# Patient Record
Sex: Female | Born: 1980
Health system: Southern US, Community
[De-identification: ages and names within clinical notes are randomized; demographics above are authoritative.]

## PROBLEM LIST (undated history)

## (undated) DIAGNOSIS — R87619 Unspecified abnormal cytological findings in specimens from cervix uteri: Secondary | ICD-10-CM

## (undated) DIAGNOSIS — F329 Major depressive disorder, single episode, unspecified: Secondary | ICD-10-CM

## (undated) DIAGNOSIS — IMO0002 Reserved for concepts with insufficient information to code with codable children: Secondary | ICD-10-CM

## (undated) DIAGNOSIS — J302 Other seasonal allergic rhinitis: Secondary | ICD-10-CM

## (undated) DIAGNOSIS — O3440 Maternal care for other abnormalities of cervix, unspecified trimester: Secondary | ICD-10-CM

## (undated) DIAGNOSIS — R87629 Unspecified abnormal cytological findings in specimens from vagina: Secondary | ICD-10-CM

## (undated) DIAGNOSIS — Z9889 Other specified postprocedural states: Secondary | ICD-10-CM

## (undated) HISTORY — DX: Unspecified abnormal cytological findings in specimens from vagina: R87.629

---
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
## 1999-07-22 DIAGNOSIS — F32A Depression, unspecified: Secondary | ICD-10-CM

## 1999-07-22 HISTORY — DX: Depression, unspecified: F32.A

## 2001-07-21 HISTORY — PX: EYE SURGERY: SHX253

## 2012-05-24 LAB — OB RESULTS CONSOLE RUBELLA ANTIBODY, IGM: Rubella: IMMUNE

## 2012-05-24 LAB — OB RESULTS CONSOLE HEPATITIS B SURFACE ANTIGEN: Hepatitis B Surface Ag: NEGATIVE

## 2012-05-24 LAB — OB RESULTS CONSOLE ABO/RH: RH Type: POSITIVE

## 2012-05-24 LAB — OB RESULTS CONSOLE HGB/HCT, BLOOD
HCT: 37 %
Hemoglobin: 12.9 g/dL

## 2012-05-24 LAB — OB RESULTS CONSOLE HIV ANTIBODY (ROUTINE TESTING): HIV: NONREACTIVE

## 2012-05-24 LAB — OB RESULTS CONSOLE RPR: RPR: REACTIVE

## 2012-05-24 LAB — OB RESULTS CONSOLE ANTIBODY SCREEN: Antibody Screen: NEGATIVE

## 2012-07-21 NOTE — L&D Delivery Note (Signed)
Delivery Note At 5:30 AM a viable and healthy female was delivered via Vaginal, Spontaneous Delivery (Presentation: OA;  ).  APGAR: 9, 9; weight .   Placenta status: Intact, Spontaneous.  Cord: 3 vessels with the following complications: None.    No difficulty with shoulders  Anesthesia: None  Episiotomy: None Lacerations: None Suture Repair: none Est. Blood Loss (mL): 200   Mom to postpartum.  Baby to nursery-stable.  [facility] 11/25/2012, 6:43 AM

## 2012-09-21 ENCOUNTER — Encounter: Payer: Self-pay | Admitting: Obstetrics & Gynecology

## 2012-09-28 ENCOUNTER — Ambulatory Visit (INDEPENDENT_AMBULATORY_CARE_PROVIDER_SITE_OTHER): Payer: BC Managed Care – PPO | Admitting: Obstetrics and Gynecology

## 2012-09-28 ENCOUNTER — Encounter: Payer: Self-pay | Admitting: Obstetrics and Gynecology

## 2012-09-28 VITALS — BP 102/68 | Wt 202.0 lb

## 2012-09-28 DIAGNOSIS — Z3483 Encounter for supervision of other normal pregnancy, third trimester: Secondary | ICD-10-CM

## 2012-09-28 DIAGNOSIS — Z3480 Encounter for supervision of other normal pregnancy, unspecified trimester: Secondary | ICD-10-CM

## 2012-09-28 DIAGNOSIS — R768 Other specified abnormal immunological findings in serum: Secondary | ICD-10-CM

## 2012-09-28 HISTORY — DX: Other specified abnormal immunological findings in serum: R76.8

## 2012-09-28 MED ORDER — TETANUS-DIPHTH-ACELL PERTUSSIS 5-2.5-18.5 LF-MCG/0.5 IM SUSP: 0.5000 mL | Freq: Once | INTRAMUSCULAR | Status: DC

## 2012-09-28 NOTE — Progress Notes (Signed)
Patient recently moved from [facility] to [place]. Here for first ob visit with [place]. Records reviewed and up to date. Thus far uncomplicated prenatal care with false positive RPR (RPR pos, confirmatory test- negative), Normal pap and other labs, normal anatomy. 1hr GCT, labs and TdaP today. FM/PTL precautions discussed.

## 2012-09-28 NOTE — Progress Notes (Signed)
P - 84 - new pt here as transfer from [facility] - just moved to this area less than one month ago

## 2012-09-30 LAB — CBC
HCT: 36 % (ref 36.0–46.0)
Hemoglobin: 11.2 g/dL — ABNORMAL LOW (ref 12.0–15.0)
MCH: 29.5 pg (ref 26.0–34.0)
MCHC: 31.1 g/dL (ref 30.0–36.0)
MCV: 94.7 fL (ref 78.0–100.0)
RDW: 13.7 % (ref 11.5–15.5)

## 2012-10-01 LAB — GLUCOSE TOLERANCE, 1 HOUR (50G) W/O FASTING

## 2012-10-19 ENCOUNTER — Ambulatory Visit (INDEPENDENT_AMBULATORY_CARE_PROVIDER_SITE_OTHER): Payer: BC Managed Care – PPO | Admitting: Family Medicine

## 2012-10-19 VITALS — BP 108/66 | Wt 205.0 lb

## 2012-10-19 DIAGNOSIS — Z3483 Encounter for supervision of other normal pregnancy, third trimester: Secondary | ICD-10-CM

## 2012-10-19 DIAGNOSIS — Z3480 Encounter for supervision of other normal pregnancy, unspecified trimester: Secondary | ICD-10-CM

## 2012-10-19 NOTE — Progress Notes (Signed)
Had 1 hrGTT last visit, but the lab cancelled it.  She wants to know if it is still necessary to repeat test.

## 2012-10-19 NOTE — Patient Instructions (Signed)
Pregnancy - Third Trimester The third trimester of pregnancy (the last 3 months) is a period of the most rapid growth for you and your baby. The baby approaches a length of 20 inches and a weight of 6 to 10 pounds. The baby is adding on fat and getting ready for life outside your body. While inside, babies have periods of sleeping and waking, suck their thumbs, and hiccups. You can often feel small contractions of the uterus. This is false labor. It is also called Braxton-Hicks contractions. This is like a practice for labor. The usual problems in this stage of pregnancy include more difficulty breathing, swelling of the hands and feet from water retention, and having to urinate more often because of the uterus and baby pressing on your bladder.  PRENATAL EXAMS  Blood work may continue to be done during prenatal exams. These tests are done to check on your health and the probable health of your baby. Blood work is used to follow your blood levels (hemoglobin). Anemia (low hemoglobin) is common during pregnancy. Iron and vitamins are given to help prevent this. You may also continue to be checked for diabetes. Some of the past blood tests may be done again.  The size of the uterus is measured during each visit. This makes sure your baby is growing properly according to your pregnancy dates.  Your blood pressure is checked every prenatal visit. This is to make sure you are not getting toxemia.  Your urine is checked every prenatal visit for infection, diabetes and protein.  Your weight is checked at each visit. This is done to make sure gains are happening at the suggested rate and that you and your baby are growing normally.  Sometimes, an ultrasound is performed to confirm the position and the proper growth and development of the baby. This is a test done that bounces harmless sound waves off the baby so your caregiver can more accurately determine due dates.  Discuss the type of pain medication  and anesthesia you will have during your labor and delivery.  Discuss the possibility and anesthesia if a Cesarean Section might be necessary.  Inform your caregiver if there is any mental or physical violence at home. Sometimes, a specialized non-stress test, contraction stress test and biophysical profile are done to make sure the baby is not having a problem. Checking the amniotic fluid surrounding the baby is called an amniocentesis. The amniotic fluid is removed by sticking a needle into the belly (abdomen). This is sometimes done near the end of pregnancy if an early delivery is required. In this case, it is done to help make sure the baby's lungs are mature enough for the baby to live outside of the womb. If the lungs are not mature and it is unsafe to deliver the baby, an injection of cortisone medication is given to the mother 1 to 2 days before the delivery. This helps the baby's lungs mature and makes it safer to deliver the baby. CHANGES OCCURING IN THE THIRD TRIMESTER OF PREGNANCY Your body goes through many changes during pregnancy. They vary from person to person. Talk to your caregiver about changes you notice and are concerned about.  During the last trimester, you have probably had an increase in your appetite. It is normal to have cravings for certain foods. This varies from person to person and pregnancy to pregnancy.  You may begin to get stretch marks on your hips, abdomen, and breasts. These are normal changes in the body  during pregnancy. There are no exercises or medications to take which prevent this change.  Constipation may be treated with a stool softener or adding bulk to your diet. Drinking lots of fluids, fiber in vegetables, fruits, and whole grains are helpful.  Exercising is also helpful. If you have been very active up until your pregnancy, most of these activities can be continued during your pregnancy. If you have been less active, it is helpful to start an  exercise program such as walking. Consult your caregiver before starting exercise programs.  Avoid all smoking, alcohol, un-prescribed drugs, herbs and "street drugs" during your pregnancy. These chemicals affect the formation and growth of the baby. Avoid chemicals throughout the pregnancy to ensure the delivery of a healthy infant.  Backache, varicose veins and hemorrhoids may develop or get worse.  You will tire more easily in the third trimester, which is normal.  The baby's movements may be stronger and more often.  You may become short of breath easily.  Your belly button may stick out.  A yellow discharge may leak from your breasts called colostrum.  You may have a bloody mucus discharge. This usually occurs a few days to a week before labor begins. HOME CARE INSTRUCTIONS   Keep your caregiver's appointments. Follow your caregiver's instructions regarding medication use, exercise, and diet.  During pregnancy, you are providing food for you and your baby. Continue to eat regular, well-balanced meals. Choose foods such as meat, fish, milk and other low fat dairy products, vegetables, fruits, and whole-grain breads and cereals. Your caregiver will tell you of the ideal weight gain.  A physical sexual relationship may be continued throughout pregnancy if there are no other problems such as early (premature) leaking of amniotic fluid from the membranes, vaginal bleeding, or belly (abdominal) pain.  Exercise regularly if there are no restrictions. Check with your caregiver if you are unsure of the safety of your exercises. Greater weight gain will occur in the last 2 trimesters of pregnancy. Exercising helps:  Control your weight.  Get you in shape for labor and delivery.  You lose weight after you deliver.  Rest a lot with legs elevated, or as needed for leg cramps or low back pain.  Wear a good support or jogging bra for breast tenderness during pregnancy. This may help if worn  during sleep. Pads or tissues may be used in the bra if you are leaking colostrum.  Do not use hot tubs, steam rooms, or saunas.  Wear your seat belt when driving. This protects you and your baby if you are in an accident.  Avoid raw meat, cat litter boxes and soil used by cats. These carry germs that can cause birth defects in the baby.  It is easier to loose urine during pregnancy. Tightening up and strengthening the pelvic muscles will help with this problem. You can practice stopping your urination while you are going to the bathroom. These are the same muscles you need to strengthen. It is also the muscles you would use if you were trying to stop from passing gas. You can practice tightening these muscles up 10 times a set and repeating this about 3 times per day. Once you know what muscles to tighten up, do not perform these exercises during urination. It is more likely to cause an infection by backing up the urine.  Ask for help if you have financial, counseling or nutritional needs during pregnancy. Your caregiver will be able to offer counseling for these  needs as well as refer you for other special needs.  Make a list of emergency phone numbers and have them available.  Plan on getting help from family or friends when you go home from the hospital.  Make a trial run to the hospital.  Take prenatal classes with the father to understand, practice and ask questions about the labor and delivery.  Prepare the baby's room/nursery.  Do not travel out of the city unless it is absolutely necessary and with the advice of your caregiver.  Wear only low or no heal shoes to have better balance and prevent falling. MEDICATIONS AND DRUG USE IN PREGNANCY  Take prenatal vitamins as directed. The vitamin should contain 1 milligram of folic acid. Keep all vitamins out of reach of children. Only a couple vitamins or tablets containing iron may be fatal to a baby or young child when  ingested.  Avoid use of all medications, including herbs, over-the-counter medications, not prescribed or suggested by your caregiver. Only take over-the-counter or prescription medicines for pain, discomfort, or fever as directed by your caregiver. Do not use aspirin, ibuprofen (Motrin, Advil, Nuprin) or naproxen (Aleve) unless OK'd by your caregiver.  Let your caregiver also know about herbs you may be using.  Alcohol is related to a number of birth defects. This includes fetal alcohol syndrome. All alcohol, in any form, should be avoided completely. Smoking will cause low birth rate and premature babies.  Street/illegal drugs are very harmful to the baby. They are absolutely forbidden. A baby born to an addicted mother will be addicted at birth. The baby will go through the same withdrawal an adult does. SEEK MEDICAL CARE IF: You have any concerns or worries during your pregnancy. It is better to call with your questions if you feel they cannot wait, rather than worry about them. DECISIONS ABOUT CIRCUMCISION You may or may not know the sex of your baby. If you know your baby is a boy, it may be time to think about circumcision. Circumcision is the removal of the foreskin of the penis. This is the skin that covers the sensitive end of the penis. There is no proven medical need for this. Often this decision is made on what is popular at the time or based upon religious beliefs and social issues. You can discuss these issues with your caregiver or pediatrician. SEEK IMMEDIATE MEDICAL CARE IF:   An unexplained oral temperature above 102 F (38.9 C) develops, or as your caregiver suggests.  You have leaking of fluid from the vagina (birth canal). If leaking membranes are suspected, take your temperature and tell your caregiver of this when you call.  There is vaginal spotting, bleeding or passing clots. Tell your caregiver of the amount and how many pads are used.  You develop a bad smelling  vaginal discharge with a change in the color from clear to white.  You develop vomiting that lasts more than 24 hours.  You develop chills or fever.  You develop shortness of breath.  You develop burning on urination.  You loose more than 2 pounds of weight or gain more than 2 pounds of weight or as suggested by your caregiver.  You notice sudden swelling of your face, hands, and feet or legs.  You develop belly (abdominal) pain. Round ligament discomfort is a common non-cancerous (benign) cause of abdominal pain in pregnancy. Your caregiver still must evaluate you.  You develop a severe headache that does not go away.  You develop visual  problems, blurred or double vision.  If you have not felt your baby move for more than 1 hour. If you think the baby is not moving as much as usual, eat something with sugar in it and lie down on your left side for an hour. The baby should move at least 4 to 5 times per hour. Call right away if your baby moves less than that.  You fall, are in a car accident or any kind of trauma.  There is mental or physical violence at home. Document Released: 07/01/2001 Document Revised: 09/29/2011 Document Reviewed: 01/03/2009 [facility] Patient Information 2013 [place], [place].  Contraception Choices Contraception (birth control) is the use of any methods or devices to prevent pregnancy. Below are some methods to help avoid pregnancy. HORMONAL METHODS   Contraceptive implant. This is a thin, plastic tube containing progesterone hormone. It does not contain estrogen hormone. Your caregiver inserts the tube in the inner part of the upper arm. The tube can remain in place for up to 3 years. After 3 years, the implant must be removed. The implant prevents the ovaries from releasing an egg (ovulation), thickens the cervical mucus which prevents sperm from entering the uterus, and thins the lining of the inside of the uterus.  Progesterone-only injections. These  injections are given every 3 months by your caregiver to prevent pregnancy. This synthetic progesterone hormone stops the ovaries from releasing eggs. It also thickens cervical mucus and changes the uterine lining. This makes it harder for sperm to survive in the uterus.  Birth control pills. These pills contain estrogen and progesterone hormone. They work by stopping the egg from forming in the ovary (ovulation). Birth control pills are prescribed by a caregiver.Birth control pills can also be used to treat heavy periods.  Minipill. This type of birth control pill contains only the progesterone hormone. They are taken every day of each month and must be prescribed by your caregiver.  Birth control patch. The patch contains hormones similar to those in birth control pills. It must be changed once a week and is prescribed by a caregiver.  Vaginal ring. The ring contains hormones similar to those in birth control pills. It is left in the vagina for 3 weeks, removed for 1 week, and then a new one is put back in place. The patient must be comfortable inserting and removing the ring from the vagina.A caregiver's prescription is necessary.  Emergency contraception. Emergency contraceptives prevent pregnancy after unprotected sexual intercourse. This pill can be taken right after sex or up to 5 days after unprotected sex. It is most effective the sooner you take the pills after having sexual intercourse. Emergency contraceptive pills are available without a prescription. Check with your pharmacist. Do not use emergency contraception as your only form of birth control. BARRIER METHODS   Female condom. This is a thin sheath (latex or rubber) that is worn over the penis during sexual intercourse. It can be used with spermicide to increase effectiveness.  Female condom. This is a soft, loose-fitting sheath that is put into the vagina before sexual intercourse.  Diaphragm. This is a soft, latex, dome-shaped  barrier that must be fitted by a caregiver. It is inserted into the vagina, along with a spermicidal jelly. It is inserted before intercourse. The diaphragm should be left in the vagina for 6 to 8 hours after intercourse.  Cervical cap. This is a round, soft, latex or plastic cup that fits over the cervix and must be fitted by a  caregiver. The cap can be left in place for up to 48 hours after intercourse.  Sponge. This is a soft, circular piece of polyurethane foam. The sponge has spermicide in it. It is inserted into the vagina after wetting it and before sexual intercourse.  Spermicides. These are chemicals that kill or block sperm from entering the cervix and uterus. They come in the form of creams, jellies, suppositories, foam, or tablets. They do not require a prescription. They are inserted into the vagina with an applicator before having sexual intercourse. The process must be repeated every time you have sexual intercourse. INTRAUTERINE CONTRACEPTION  Intrauterine device (IUD). This is a T-shaped device that is put in a woman's uterus during a menstrual period to prevent pregnancy. There are 2 types:  Copper IUD. This type of IUD is wrapped in copper wire and is placed inside the uterus. Copper makes the uterus and fallopian tubes produce a fluid that kills sperm. It can stay in place for 10 years.  Hormone IUD. This type of IUD contains the hormone progestin (synthetic progesterone). The hormone thickens the cervical mucus and prevents sperm from entering the uterus, and it also thins the uterine lining to prevent implantation of a fertilized egg. The hormone can weaken or kill the sperm that get into the uterus. It can stay in place for 5 years. PERMANENT METHODS OF CONTRACEPTION  Female tubal ligation. This is when the woman's fallopian tubes are surgically sealed, tied, or blocked to prevent the egg from traveling to the uterus.  Female sterilization. This is when the female has the tubes  that carry sperm tied off (vasectomy).This blocks sperm from entering the vagina during sexual intercourse. After the procedure, the man can still ejaculate fluid (semen). NATURAL PLANNING METHODS  Natural family planning. This is not having sexual intercourse or using a barrier method (condom, diaphragm, cervical cap) on days the woman could become pregnant.  Calendar method. This is keeping track of the length of each menstrual cycle and identifying when you are fertile.  Ovulation method. This is avoiding sexual intercourse during ovulation.  Symptothermal method. This is avoiding sexual intercourse during ovulation, using a thermometer and ovulation symptoms.  Post-ovulation method. This is timing sexual intercourse after you have ovulated. Regardless of which type or method of contraception you choose, it is important that you use condoms to protect against the transmission of sexually transmitted diseases (STDs). Talk with your caregiver about which form of contraception is most appropriate for you. Document Released: 07/07/2005 Document Revised: 09/29/2011 Document Reviewed: 11/13/2010 [facility] Patient Information 2013 [place], [place].  Breastfeeding Deciding to breastfeed is one of the best choices you can make for you and your baby. The information that follows gives a brief overview of the benefits of breastfeeding as well as common topics surrounding breastfeeding. BENEFITS OF BREASTFEEDING For the baby  The first milk (colostrum) helps the baby's digestive system function better.   There are antibodies in the mother's milk that help the baby fight off infections.   The baby has a lower incidence of asthma, allergies, and sudden infant death syndrome (SIDS).   The nutrients in breast milk are better for the baby than infant formulas, and breast milk helps the baby's brain grow better.   Babies who breastfeed have less gas, colic, and constipation.  For the  mother  Breastfeeding helps develop a very special bond between the mother and her baby.   Breastfeeding is convenient, always available at the correct temperature, and costs nothing.  Breastfeeding burns calories in the mother and helps her lose weight that was gained during pregnancy.   Breastfeeding makes the uterus contract back down to normal size faster and slows bleeding following delivery.   Breastfeeding mothers have a lower risk of developing breast cancer.  BREASTFEEDING FREQUENCY  A healthy, full-term baby may breastfeed as often as every hour or space his or her feedings to every 3 hours.   Watch your baby for signs of hunger. Nurse your baby if he or she shows signs of hunger. How often you nurse will vary from baby to baby.   Nurse as often as the baby requests, or when you feel the need to reduce the fullness of your breasts.   Awaken the baby if it has been 3 4 hours since the last feeding.   Frequent feeding will help the mother make more milk and will help prevent problems, such as sore nipples and engorgement of the breasts.  BABY'S POSITION AT THE BREAST  Whether lying down or sitting, be sure that the baby's tummy is facing your tummy.   Support the breast with 4 fingers underneath the breast and the thumb above. Make sure your fingers are well away from the nipple and baby's mouth.   Stroke the baby's lips gently with your finger or nipple.   When the baby's mouth is open wide enough, place all of your nipple and as much of the areola as possible into your baby's mouth.   Pull the baby in close so the tip of the nose and the baby's cheeks touch the breast during the feeding.  FEEDINGS AND SUCTION  The length of each feeding varies from baby to baby and from feeding to feeding.   The baby must suck about 2 3 minutes for your milk to get to him or her. This is called a "let down." For this reason, allow the baby to feed on each breast as long  as he or she wants. Your baby will end the feeding when he or she has received the right balance of nutrients.   To break the suction, put your finger into the corner of the baby's mouth and slide it between his or her gums before removing your breast from his or her mouth. This will help prevent sore nipples.  HOW TO TELL WHETHER YOUR BABY IS GETTING ENOUGH BREAST MILK. Wondering whether or not your baby is getting enough milk is a common concern among mothers. You can be assured that your baby is getting enough milk if:   Your baby is actively sucking and you hear swallowing.   Your baby seems relaxed and satisfied after a feeding.   Your baby nurses at least 8 12 times in a 24 hour time period. Nurse your baby until he or she unlatches or falls asleep at the first breast (at least 10 20 minutes), then offer the second side.   Your baby is wetting 5 6 disposable diapers (6 8 cloth diapers) in a 24 hour period by 57 4 days of age.   Your baby is having at least 3 4 stools every 24 hours for the first 6 weeks. The stool should be soft and yellow.   Your baby should gain 4 7 ounces per week after he or she is 76 days old.   Your breasts feel softer after nursing.  REDUCING BREAST ENGORGEMENT  In the first week after your baby is born, you may experience signs of breast engorgement. When breasts  are engorged, they feel heavy, warm, full, and may be tender to the touch. You can reduce engorgement if you:   Nurse frequently, every 2 3 hours. Mothers who breastfeed early and often have fewer problems with engorgement.   Place light ice packs on your breasts for 10 20 minutes between feedings. This reduces swelling. Wrap the ice packs in a lightweight towel to protect your skin. Bags of frozen vegetables work well for this purpose.   Take a warm shower or apply warm, moist heat to your breast for 5 10 minutes just before each feeding. This increases circulation and helps the milk flow.    Gently massage your breast before and during the feeding. Using your finger tips, massage from the chest wall towards your nipple in a circular motion.   Make sure that the baby empties at least one breast at every feeding before switching sides.   Use a breast pump to empty the breasts if your baby is sleepy or not nursing well. You may also want to pump if you are returning to work oryou feel you are getting engorged.   Avoid bottle feeds, pacifiers, or supplemental feedings of water or juice in place of breastfeeding. Breast milk is all the food your baby needs. It is not necessary for your baby to have water or formula. In fact, to help your breasts make more milk, it is best not to give your baby supplemental feedings during the early weeks.   Be sure the baby is latched on and positioned properly while breastfeeding.   Wear a supportive bra, avoiding underwire styles.   Eat a balanced diet with enough fluids.   Rest often, relax, and take your prenatal vitamins to prevent fatigue, stress, and anemia.  If you follow these suggestions, your engorgement should improve in 24 48 hours. If you are still experiencing difficulty, call your lactation consultant or caregiver.  CARING FOR YOURSELF Take care of your breasts  Bathe or shower daily.   Avoid using soap on your nipples.   Start feedings on your left breast at one feeding and on your right breast at the next feeding.   You will notice an increase in your milk supply 2 5 days after delivery. You may feel some discomfort from engorgement, which makes your breasts very firm and often tender. Engorgement "peaks" out within 24 48 hours. In the meantime, apply warm moist towels to your breasts for 5 10 minutes before feeding. Gentle massage and expression of some milk before feeding will soften your breasts, making it easier for your baby to latch on.   Wear a well-fitting nursing bra, and air dry your nipples for a 3  after each feeding.   Only use cotton bra pads.   Only use pure lanolin on your nipples after nursing. You do not need to wash it off before feeding the baby again. Another option is to express a few drops of breast milk and gently massage it into your nipples.  Take care of yourself  Eat well-balanced meals and nutritious snacks.   Drinking milk, fruit juice, and water to satisfy your thirst (about 8 glasses a day).   Get plenty of rest.  Avoid foods that you notice affect the baby in a bad way.  SEEK MEDICAL CARE IF:   You have difficulty with breastfeeding and need help.   You have a hard, red, sore area on your breast that is accompanied by a fever.   Your baby is  too sleepy to eat well or is having trouble sleeping.   Your baby is wetting less than 6 diapers a day, by 2 days of age.   Your baby's skin or white part of his or her eyes is more yellow than it was in the hospital.   You feel depressed.  Document Released: 07/07/2005 Document Revised: 01/06/2012 Document Reviewed: 10/05/2011 [facility] Patient Information 2013 [place], [place].

## 2012-10-19 NOTE — Progress Notes (Signed)
Discussed 1 hour with pt.  She will repeat 1 hour this week. Otherwise doing well.  No concerns.

## 2012-10-21 ENCOUNTER — Other Ambulatory Visit: Payer: BC Managed Care – PPO | Admitting: *Deleted

## 2012-10-21 DIAGNOSIS — Z3482 Encounter for supervision of other normal pregnancy, second trimester: Secondary | ICD-10-CM

## 2012-10-22 ENCOUNTER — Encounter: Payer: Self-pay | Admitting: Family Medicine

## 2012-10-27 ENCOUNTER — Other Ambulatory Visit (INDEPENDENT_AMBULATORY_CARE_PROVIDER_SITE_OTHER): Payer: BC Managed Care – PPO | Admitting: *Deleted

## 2012-10-27 DIAGNOSIS — R7309 Other abnormal glucose: Secondary | ICD-10-CM

## 2012-10-27 NOTE — Progress Notes (Signed)
Patient is here for 3 hour glucose tolerance testing.

## 2012-10-28 ENCOUNTER — Encounter: Payer: Self-pay | Admitting: Family Medicine

## 2012-10-28 LAB — GLUCOSE TOLERANCE, 3 HOURS: Glucose, GTT - 3 Hour: 67 mg/dL — ABNORMAL LOW (ref 70–144)

## 2012-11-02 ENCOUNTER — Encounter: Payer: Self-pay | Admitting: Obstetrics and Gynecology

## 2012-11-02 ENCOUNTER — Ambulatory Visit (INDEPENDENT_AMBULATORY_CARE_PROVIDER_SITE_OTHER): Payer: BC Managed Care – PPO | Admitting: Obstetrics and Gynecology

## 2012-11-02 VITALS — BP 110/70 | Wt 207.0 lb

## 2012-11-02 DIAGNOSIS — R768 Other specified abnormal immunological findings in serum: Secondary | ICD-10-CM

## 2012-11-02 DIAGNOSIS — Z3483 Encounter for supervision of other normal pregnancy, third trimester: Secondary | ICD-10-CM

## 2012-11-02 DIAGNOSIS — Z3480 Encounter for supervision of other normal pregnancy, unspecified trimester: Secondary | ICD-10-CM

## 2012-11-02 NOTE — Progress Notes (Signed)
Patient doing well without complaints. FM/PTL precautions reviewed. Patient remains remains undecided on birth control. Husband was suppose to have vasectomy but desires to try again for a boy. She is considering BTL vs LARC. Cultures next visit.

## 2012-11-02 NOTE — Patient Instructions (Signed)
Etonogestrel implant What is this medicine? ETONOGESTREL is a contraceptive (birth control) device. It is used to prevent pregnancy. It can be used for up to 3 years. This medicine may be used for other purposes; ask your health care provider or pharmacist if you have questions. What should I tell my health care provider before I take this medicine? They need to know if you have any of these conditions: -abnormal vaginal bleeding -blood vessel disease or blood clots -cancer of the breast, cervix, or liver -depression -diabetes -gallbladder disease -headaches -heart disease or recent heart attack -high blood pressure -high cholesterol -kidney disease -liver disease -renal disease -seizures -tobacco smoker -an unusual or allergic reaction to etonogestrel, other hormones, anesthetics or antiseptics, medicines, foods, dyes, or preservatives -pregnant or trying to get pregnant -breast-feeding How should I use this medicine? This device is inserted just under the skin on the inner side of your upper arm by a health care professional. Talk to your pediatrician regarding the use of this medicine in children. Special care may be needed. Overdosage: If you think you've taken too much of this medicine contact a poison control center or emergency room at once. Overdosage: If you think you have taken too much of this medicine contact a poison control center or emergency room at once. NOTE: This medicine is only for you. Do not share this medicine with others. What if I miss a dose? This does not apply. What may interact with this medicine? Do not take this medicine with any of the following medications: -amprenavir -bosentan -fosamprenavir This medicine may also interact with the following medications: -barbiturate medicines for inducing sleep or treating seizures -certain medicines for fungal infections like ketoconazole and itraconazole -griseofulvin -medicines to treat seizures like  carbamazepine, felbamate, oxcarbazepine, phenytoin, topiramate -modafinil -phenylbutazone -rifampin -some medicines to treat HIV infection like atazanavir, indinavir, lopinavir, nelfinavir, tipranavir, ritonavir -St. John's wort This list may not describe all possible interactions. Give your health care provider a list of all the medicines, herbs, non-prescription drugs, or dietary supplements you use. Also tell them if you smoke, drink alcohol, or use illegal drugs. Some items may interact with your medicine. What should I watch for while using this medicine? This product does not protect you against HIV infection (AIDS) or other sexually transmitted diseases. You should be able to feel the implant by pressing your fingertips over the skin where it was inserted. Tell your doctor if you cannot feel the implant. What side effects may I notice from receiving this medicine? Side effects that you should report to your doctor or health care professional as soon as possible: -allergic reactions like skin rash, itching or hives, swelling of the face, lips, or tongue -breast lumps -changes in vision -confusion, trouble speaking or understanding -dark urine -depressed mood -general ill feeling or flu-like symptoms -light-colored stools -loss of appetite, nausea -right upper belly pain -severe headaches -severe pain, swelling, or tenderness in the abdomen -shortness of breath, chest pain, swelling in a leg -signs of pregnancy -sudden numbness or weakness of the face, arm or leg -trouble walking, dizziness, loss of balance or coordination -unusual vaginal bleeding, discharge -unusually weak or tired -yellowing of the eyes or skin Side effects that usually do not require medical attention (Report these to your doctor or health care professional if they continue or are bothersome.): -acne -breast pain -changes in weight -cough -fever or chills -headache -irregular menstrual  bleeding -itching, burning, and vaginal discharge -pain or difficulty passing urine -sore throat This   list may not describe all possible side effects. Call your doctor for medical advice about side effects. You may report side effects to FDA at 1-800-FDA-1088. Where should I keep my medicine? This drug is given in a hospital or clinic and will not be stored at home. NOTE: This sheet is a summary. It may not cover all possible information. If you have questions about this medicine, talk to your doctor, pharmacist, or health care provider.  2013, [employer]. (03/30/2009 3:54:17 PM)   Contraception Choices Birth control (contraception) can stop pregnancy from happening. Different types of birth control work in different ways. Some can:  Make the mucus in the cervix thick. This makes it hard for sperm to get into the uterus.  Thin the lining of the uterus. This makes it hard for an egg to attach to the wall of the uterus.  Stop the ovaries from releasing an egg.  Block the sperm from reaching the egg. Certain types of surgery can stop pregnancy from happening. For women, the sugery closes the fallopian tubes (tubal ligation). For men, the surgery stops sperm from releasing during sex (vasectomy). HORMONAL BIRTH CONTROL Hormonal birth control stops pregnancy by putting hormones into your body. Types of birth control include:  A small tube put under the skin of the upper arm (implant). The tube can stay in place for 3 years.  Shots given every 3 months.  Pills taken every day or once after sex (intercourse).  Patches that are changed once a week.  A ring put into the vagina (vaginal ring). The ring is left in place for 3 weeks and removed for 1 week. Then, a new ring is put in the vagina. BARRIER BIRTH CONTROL  Barrier birth control blocks sperm from reaching the egg. Types of birth control include:   A thin covering worn on the penis (female condom) during sex.  A soft,  loose covering put into the vagina (female condom) before sex.  A rubber bowl that sits over the cervix (diaphragm). The bowl must be made for you. The bowl is put into the vagina before sex. The bowl is left in place for 6 to 8 hours after sex.  A small, soft cup that fits over the cervix (cervical cap). The cup must be made for you. The cup can be left in place for 48 hours after sex.  A sponge that is put into the vagina before sex.  A chemical that kills or blocks sperm from getting into the cervix and uterus (spermicide). The chemical may be a cream, jelly, foam, or pill. INTRAUTERINE (IUD) BIRTH CONTROL  IUD birth control is a small, T-shaped piece of plastic. The plastic is put inside the uterus. There are 2 types of IUD:  Copper IUD. The IUD is covered in copper wire. The copper makes a fluid that kills sperm. It can stay in place for 10 years.  Hormone IUD. The hormone stops pregnancy from happening. It can stay in place for 5 years. NATURAL FAMILY PLANNING BIRTH CONTROL  Natural family planning means not having sex or using barrier birth control when the woman is fertile. A woman can:  Use a calendar to keep track of when she is fertile.  Use a thermometer to measure her body temperature. Protect yourself against sexual diseases no matter what type of birth control you use. Talk to your doctor about which type of birth control is best for you. Document Released: 05/04/2009 Document Revised: 09/29/2011 Document Reviewed: 11/13/2010 [employer] Patient  Information 2013 [place], [place].

## 2012-11-09 ENCOUNTER — Encounter: Payer: Self-pay | Admitting: Family Medicine

## 2012-11-09 ENCOUNTER — Ambulatory Visit (INDEPENDENT_AMBULATORY_CARE_PROVIDER_SITE_OTHER): Payer: BC Managed Care – PPO | Admitting: Family Medicine

## 2012-11-09 VITALS — BP 107/73 | Wt 208.0 lb

## 2012-11-09 DIAGNOSIS — R768 Other specified abnormal immunological findings in serum: Secondary | ICD-10-CM

## 2012-11-09 DIAGNOSIS — Z3483 Encounter for supervision of other normal pregnancy, third trimester: Secondary | ICD-10-CM

## 2012-11-09 DIAGNOSIS — Z3480 Encounter for supervision of other normal pregnancy, unspecified trimester: Secondary | ICD-10-CM

## 2012-11-09 LAB — OB RESULTS CONSOLE GBS: GBS: NEGATIVE

## 2012-11-09 LAB — OB RESULTS CONSOLE GC/CHLAMYDIA: Chlamydia: NEGATIVE

## 2012-11-09 NOTE — Patient Instructions (Signed)
Pregnancy - Third Trimester The third trimester of pregnancy (the last 3 months) is a period of the most rapid growth for you and your baby. The baby approaches a length of 20 inches and a weight of 6 to 10 pounds. The baby is adding on fat and getting ready for life outside your body. While inside, babies have periods of sleeping and waking, suck their thumbs, and hiccups. You can often feel small contractions of the uterus. This is false labor. It is also called Braxton-Hicks contractions. This is like a practice for labor. The usual problems in this stage of pregnancy include more difficulty breathing, swelling of the hands and feet from water retention, and having to urinate more often because of the uterus and baby pressing on your bladder.  PRENATAL EXAMS  Blood work may continue to be done during prenatal exams. These tests are done to check on your health and the probable health of your baby. Blood work is used to follow your blood levels (hemoglobin). Anemia (low hemoglobin) is common during pregnancy. Iron and vitamins are given to help prevent this. You may also continue to be checked for diabetes. Some of the past blood tests may be done again.  The size of the uterus is measured during each visit. This makes sure your baby is growing properly according to your pregnancy dates.  Your blood pressure is checked every prenatal visit. This is to make sure you are not getting toxemia.  Your urine is checked every prenatal visit for infection, diabetes and protein.  Your weight is checked at each visit. This is done to make sure gains are happening at the suggested rate and that you and your baby are growing normally.  Sometimes, an ultrasound is performed to confirm the position and the proper growth and development of the baby. This is a test done that bounces harmless sound waves off the baby so your caregiver can more accurately determine due dates.  Discuss the type of pain medication  and anesthesia you will have during your labor and delivery.  Discuss the possibility and anesthesia if a Cesarean Section might be necessary.  Inform your caregiver if there is any mental or physical violence at home. Sometimes, a specialized non-stress test, contraction stress test and biophysical profile are done to make sure the baby is not having a problem. Checking the amniotic fluid surrounding the baby is called an amniocentesis. The amniotic fluid is removed by sticking a needle into the belly (abdomen). This is sometimes done near the end of pregnancy if an early delivery is required. In this case, it is done to help make sure the baby's lungs are mature enough for the baby to live outside of the womb. If the lungs are not mature and it is unsafe to deliver the baby, an injection of cortisone medication is given to the mother 1 to 2 days before the delivery. This helps the baby's lungs mature and makes it safer to deliver the baby. CHANGES OCCURING IN THE THIRD TRIMESTER OF PREGNANCY Your body goes through many changes during pregnancy. They vary from person to person. Talk to your caregiver about changes you notice and are concerned about.  During the last trimester, you have probably had an increase in your appetite. It is normal to have cravings for certain foods. This varies from person to person and pregnancy to pregnancy.  You may begin to get stretch marks on your hips, abdomen, and breasts. These are normal changes in the body   during pregnancy. There are no exercises or medications to take which prevent this change.  Constipation may be treated with a stool softener or adding bulk to your diet. Drinking lots of fluids, fiber in vegetables, fruits, and whole grains are helpful.  Exercising is also helpful. If you have been very active up until your pregnancy, most of these activities can be continued during your pregnancy. If you have been less active, it is helpful to start an  exercise program such as walking. Consult your caregiver before starting exercise programs.  Avoid all smoking, alcohol, un-prescribed drugs, herbs and "street drugs" during your pregnancy. These chemicals affect the formation and growth of the baby. Avoid chemicals throughout the pregnancy to ensure the delivery of a healthy infant.  Backache, varicose veins and hemorrhoids may develop or get worse.  You will tire more easily in the third trimester, which is normal.  The baby's movements may be stronger and more often.  You may become short of breath easily.  Your belly button may stick out.  A yellow discharge may leak from your breasts called colostrum.  You may have a bloody mucus discharge. This usually occurs a few days to a week before labor begins. HOME CARE INSTRUCTIONS   Keep your caregiver's appointments. Follow your caregiver's instructions regarding medication use, exercise, and diet.  During pregnancy, you are providing food for you and your baby. Continue to eat regular, well-balanced meals. Choose foods such as meat, fish, milk and other low fat dairy products, vegetables, fruits, and whole-grain breads and cereals. Your caregiver will tell you of the ideal weight gain.  A physical sexual relationship may be continued throughout pregnancy if there are no other problems such as early (premature) leaking of amniotic fluid from the membranes, vaginal bleeding, or belly (abdominal) pain.  Exercise regularly if there are no restrictions. Check with your caregiver if you are unsure of the safety of your exercises. Greater weight gain will occur in the last 2 trimesters of pregnancy. Exercising helps:  Control your weight.  Get you in shape for labor and delivery.  You lose weight after you deliver.  Rest a lot with legs elevated, or as needed for leg cramps or low back pain.  Wear a good support or jogging bra for breast tenderness during pregnancy. This may help if worn  during sleep. Pads or tissues may be used in the bra if you are leaking colostrum.  Do not use hot tubs, steam rooms, or saunas.  Wear your seat belt when driving. This protects you and your baby if you are in an accident.  Avoid raw meat, cat litter boxes and soil used by cats. These carry germs that can cause birth defects in the baby.  It is easier to loose urine during pregnancy. Tightening up and strengthening the pelvic muscles will help with this problem. You can practice stopping your urination while you are going to the bathroom. These are the same muscles you need to strengthen. It is also the muscles you would use if you were trying to stop from passing gas. You can practice tightening these muscles up 10 times a set and repeating this about 3 times per day. Once you know what muscles to tighten up, do not perform these exercises during urination. It is more likely to cause an infection by backing up the urine.  Ask for help if you have financial, counseling or nutritional needs during pregnancy. Your caregiver will be able to offer counseling for these   needs as well as refer you for other special needs.  Make a list of emergency phone numbers and have them available.  Plan on getting help from family or friends when you go home from the hospital.  Make a trial run to the hospital.  Take prenatal classes with the father to understand, practice and ask questions about the labor and delivery.  Prepare the baby's room/nursery.  Do not travel out of the city unless it is absolutely necessary and with the advice of your caregiver.  Wear only low or no heal shoes to have better balance and prevent falling. MEDICATIONS AND DRUG USE IN PREGNANCY  Take prenatal vitamins as directed. The vitamin should contain 1 milligram of folic acid. Keep all vitamins out of reach of children. Only a couple vitamins or tablets containing iron may be fatal to a baby or young child when  ingested.  Avoid use of all medications, including herbs, over-the-counter medications, not prescribed or suggested by your caregiver. Only take over-the-counter or prescription medicines for pain, discomfort, or fever as directed by your caregiver. Do not use aspirin, ibuprofen (Motrin, Advil, Nuprin) or naproxen (Aleve) unless OK'd by your caregiver.  Let your caregiver also know about herbs you may be using.  Alcohol is related to a number of birth defects. This includes fetal alcohol syndrome. All alcohol, in any form, should be avoided completely. Smoking will cause low birth rate and premature babies.  Street/illegal drugs are very harmful to the baby. They are absolutely forbidden. A baby born to an addicted mother will be addicted at birth. The baby will go through the same withdrawal an adult does. SEEK MEDICAL CARE IF: You have any concerns or worries during your pregnancy. It is better to call with your questions if you feel they cannot wait, rather than worry about them. DECISIONS ABOUT CIRCUMCISION You may or may not know the sex of your baby. If you know your baby is a boy, it may be time to think about circumcision. Circumcision is the removal of the foreskin of the penis. This is the skin that covers the sensitive end of the penis. There is no proven medical need for this. Often this decision is made on what is popular at the time or based upon religious beliefs and social issues. You can discuss these issues with your caregiver or pediatrician. SEEK IMMEDIATE MEDICAL CARE IF:   An unexplained oral temperature above 102 F (38.9 C) develops, or as your caregiver suggests.  You have leaking of fluid from the vagina (birth canal). If leaking membranes are suspected, take your temperature and tell your caregiver of this when you call.  There is vaginal spotting, bleeding or passing clots. Tell your caregiver of the amount and how many pads are used.  You develop a bad smelling  vaginal discharge with a change in the color from clear to white.  You develop vomiting that lasts more than 24 hours.  You develop chills or fever.  You develop shortness of breath.  You develop burning on urination.  You loose more than 2 pounds of weight or gain more than 2 pounds of weight or as suggested by your caregiver.  You notice sudden swelling of your face, hands, and feet or legs.  You develop belly (abdominal) pain. Round ligament discomfort is a common non-cancerous (benign) cause of abdominal pain in pregnancy. Your caregiver still must evaluate you.  You develop a severe headache that does not go away.  You develop visual   problems, blurred or double vision.  If you have not felt your baby move for more than 1 hour. If you think the baby is not moving as much as usual, eat something with sugar in it and lie down on your left side for an hour. The baby should move at least 4 to 5 times per hour. Call right away if your baby moves less than that.  You fall, are in a car accident or any kind of trauma.  There is mental or physical violence at home. Document Released: 07/01/2001 Document Revised: 09/29/2011 Document Reviewed: 01/03/2009 [employer] Patient Information 2013 [employer].  Breastfeeding Deciding to breastfeed is one of the best choices you can make for you and your baby. The information that follows gives a brief overview of the benefits of breastfeeding as well as common topics surrounding breastfeeding. BENEFITS OF BREASTFEEDING For the baby  The first milk (colostrum) helps the baby's digestive system function better.   There are antibodies in the mother's milk that help the baby fight off infections.   The baby has a lower incidence of asthma, allergies, and sudden infant death syndrome (SIDS).   The nutrients in breast milk are better for the baby than infant formulas, and breast milk helps the baby's brain grow better.   Babies who  breastfeed have less gas, colic, and constipation.  For the mother  Breastfeeding helps develop a very special bond between the mother and her baby.   Breastfeeding is convenient, always available at the correct temperature, and costs nothing.   Breastfeeding burns calories in the mother and helps her lose weight that was gained during pregnancy.   Breastfeeding makes the uterus contract back down to normal size faster and slows bleeding following delivery.   Breastfeeding mothers have a lower risk of developing breast cancer.  BREASTFEEDING FREQUENCY  A healthy, full-term baby may breastfeed as often as every hour or space his or her feedings to every 3 hours.   Watch your baby for signs of hunger. Nurse your baby if he or she shows signs of hunger. How often you nurse will vary from baby to baby.   Nurse as often as the baby requests, or when you feel the need to reduce the fullness of your breasts.   Awaken the baby if it has been 3 4 hours since the last feeding.   Frequent feeding will help the mother make more milk and will help prevent problems, such as sore nipples and engorgement of the breasts.  BABY'S POSITION AT THE BREAST  Whether lying down or sitting, be sure that the baby's tummy is facing your tummy.   Support the breast with 4 fingers underneath the breast and the thumb above. Make sure your fingers are well away from the nipple and baby's mouth.   Stroke the baby's lips gently with your finger or nipple.   When the baby's mouth is open wide enough, place all of your nipple and as much of the areola as possible into your baby's mouth.   Pull the baby in close so the tip of the nose and the baby's cheeks touch the breast during the feeding.  FEEDINGS AND SUCTION  The length of each feeding varies from baby to baby and from feeding to feeding.   The baby must suck about 2 3 minutes for your milk to get to him or her. This is called a "let down."  For this reason, allow the baby to feed on each breast as   long as he or she wants. Your baby will end the feeding when he or she has received the right balance of nutrients.   To break the suction, put your finger into the corner of the baby's mouth and slide it between his or her gums before removing your breast from his or her mouth. This will help prevent sore nipples.  HOW TO TELL WHETHER YOUR BABY IS GETTING ENOUGH BREAST MILK. Wondering whether or not your baby is getting enough milk is a common concern among mothers. You can be assured that your baby is getting enough milk if:   Your baby is actively sucking and you hear swallowing.   Your baby seems relaxed and satisfied after a feeding.   Your baby nurses at least 8 12 times in a 24 hour time period. Nurse your baby until he or she unlatches or falls asleep at the first breast (at least 10 20 minutes), then offer the second side.   Your baby is wetting 5 6 disposable diapers (6 8 cloth diapers) in a 24 hour period by 5 6 days of age.   Your baby is having at least 3 4 stools every 24 hours for the first 6 weeks. The stool should be soft and yellow.   Your baby should gain 4 7 ounces per week after he or she is 4 days old.   Your breasts feel softer after nursing.  REDUCING BREAST ENGORGEMENT  In the first week after your baby is born, you may experience signs of breast engorgement. When breasts are engorged, they feel heavy, warm, full, and may be tender to the touch. You can reduce engorgement if you:   Nurse frequently, every 2 3 hours. Mothers who breastfeed early and often have fewer problems with engorgement.   Place light ice packs on your breasts for 10 20 minutes between feedings. This reduces swelling. Wrap the ice packs in a lightweight towel to protect your skin. Bags of frozen vegetables work well for this purpose.   Take a warm shower or apply warm, moist heat to your breast for 5 10 minutes just before  each feeding. This increases circulation and helps the milk flow.   Gently massage your breast before and during the feeding. Using your finger tips, massage from the chest wall towards your nipple in a circular motion.   Make sure that the baby empties at least one breast at every feeding before switching sides.   Use a breast pump to empty the breasts if your baby is sleepy or not nursing well. You may also want to pump if you are returning to work oryou feel you are getting engorged.   Avoid bottle feeds, pacifiers, or supplemental feedings of water or juice in place of breastfeeding. Breast milk is all the food your baby needs. It is not necessary for your baby to have water or formula. In fact, to help your breasts make more milk, it is best not to give your baby supplemental feedings during the early weeks.   Be sure the baby is latched on and positioned properly while breastfeeding.   Wear a supportive bra, avoiding underwire styles.   Eat a balanced diet with enough fluids.   Rest often, relax, and take your prenatal vitamins to prevent fatigue, stress, and anemia.  If you follow these suggestions, your engorgement should improve in 24 48 hours. If you are still experiencing difficulty, call your lactation consultant or caregiver.  CARING FOR YOURSELF Take care of your   breasts  Bathe or shower daily.   Avoid using soap on your nipples.   Start feedings on your left breast at one feeding and on your right breast at the next feeding.   You will notice an increase in your milk supply 2 5 days after delivery. You may feel some discomfort from engorgement, which makes your breasts very firm and often tender. Engorgement "peaks" out within 24 48 hours. In the meantime, apply warm moist towels to your breasts for 5 10 minutes before feeding. Gentle massage and expression of some milk before feeding will soften your breasts, making it easier for your baby to latch on.    Wear a well-fitting nursing bra, and air dry your nipples for a 3 4minutes after each feeding.   Only use cotton bra pads.   Only use pure lanolin on your nipples after nursing. You do not need to wash it off before feeding the baby again. Another option is to express a few drops of breast milk and gently massage it into your nipples.  Take care of yourself  Eat well-balanced meals and nutritious snacks.   Drinking milk, fruit juice, and water to satisfy your thirst (about 8 glasses a day).   Get plenty of rest.  Avoid foods that you notice affect the baby in a bad way.  SEEK MEDICAL CARE IF:   You have difficulty with breastfeeding and need help.   You have a hard, red, sore area on your breast that is accompanied by a fever.   Your baby is too sleepy to eat well or is having trouble sleeping.   Your baby is wetting less than 6 diapers a day, by 5 days of age.   Your baby's skin or white part of his or her eyes is more yellow than it was in the hospital.   You feel depressed.  Document Released: 07/07/2005 Document Revised: 01/06/2012 Document Reviewed: 10/05/2011 [employer] Patient Information 2013 [employer].  

## 2012-11-09 NOTE — Progress Notes (Signed)
Cultures today 

## 2012-11-10 LAB — GC/CHLAMYDIA PROBE AMP
CT Probe RNA: NEGATIVE
GC Probe RNA: NEGATIVE

## 2012-11-12 ENCOUNTER — Encounter: Payer: Self-pay | Admitting: Family Medicine

## 2012-11-12 ENCOUNTER — Inpatient Hospital Stay (HOSPITAL_COMMUNITY): Admit: 2012-11-12 | Payer: Self-pay | Admitting: Obstetrics & Gynecology

## 2012-11-16 ENCOUNTER — Encounter: Payer: Self-pay | Admitting: Obstetrics & Gynecology

## 2012-11-16 ENCOUNTER — Ambulatory Visit (INDEPENDENT_AMBULATORY_CARE_PROVIDER_SITE_OTHER): Payer: BC Managed Care – PPO | Admitting: Obstetrics & Gynecology

## 2012-11-16 VITALS — BP 102/72 | Wt 208.0 lb

## 2012-11-16 DIAGNOSIS — Z3480 Encounter for supervision of other normal pregnancy, unspecified trimester: Secondary | ICD-10-CM

## 2012-11-16 DIAGNOSIS — Z3483 Encounter for supervision of other normal pregnancy, third trimester: Secondary | ICD-10-CM

## 2012-11-16 NOTE — Progress Notes (Signed)
Routine visit. Good FM. Labor precautions reviewed. No OB problems. We discussed hip-opening exercises.

## 2012-11-16 NOTE — Progress Notes (Signed)
Right hip pain  

## 2012-11-21 ENCOUNTER — Inpatient Hospital Stay (HOSPITAL_COMMUNITY)
Admission: AD | Admit: 2012-11-21 | Discharge: 2012-11-22 | Disposition: A | Discharge status: Home / Self Care | Payer: BC Managed Care – PPO | Source: Ambulatory Visit | Attending: Obstetrics and Gynecology | Admitting: Obstetrics and Gynecology

## 2012-11-21 ENCOUNTER — Encounter (HOSPITAL_COMMUNITY): Payer: Self-pay | Admitting: *Deleted

## 2012-11-21 DIAGNOSIS — O479 False labor, unspecified: Secondary | ICD-10-CM | POA: Insufficient documentation

## 2012-11-21 HISTORY — DX: Reserved for concepts with insufficient information to code with codable children: IMO0002

## 2012-11-21 HISTORY — DX: Maternal care for other abnormalities of cervix, unspecified trimester: O34.40

## 2012-11-21 HISTORY — DX: Other specified postprocedural states: Z98.890

## 2012-11-21 HISTORY — DX: Unspecified abnormal cytological findings in specimens from cervix uteri: R87.619

## 2012-11-21 HISTORY — DX: Other seasonal allergic rhinitis: J30.2

## 2012-11-21 HISTORY — DX: Major depressive disorder, single episode, unspecified: F32.9

## 2012-11-21 NOTE — MAU Note (Signed)
Sent to birthing suites room 169 to be triaged.

## 2012-11-22 ENCOUNTER — Ambulatory Visit (INDEPENDENT_AMBULATORY_CARE_PROVIDER_SITE_OTHER): Payer: BC Managed Care – PPO | Admitting: Obstetrics & Gynecology

## 2012-11-22 ENCOUNTER — Encounter (HOSPITAL_COMMUNITY): Payer: Self-pay | Admitting: *Deleted

## 2012-11-22 ENCOUNTER — Encounter: Payer: Self-pay | Admitting: Obstetrics & Gynecology

## 2012-11-22 VITALS — BP 116/76 | Wt 206.0 lb

## 2012-11-22 DIAGNOSIS — Z3480 Encounter for supervision of other normal pregnancy, unspecified trimester: Secondary | ICD-10-CM

## 2012-11-22 DIAGNOSIS — Z3483 Encounter for supervision of other normal pregnancy, third trimester: Secondary | ICD-10-CM

## 2012-11-22 MED ORDER — NALBUPHINE SYRINGE 5 MG/0.5 ML: 10.0000 mg | INJECTION | Freq: Once | INTRAMUSCULAR | Status: DC

## 2012-11-22 NOTE — Progress Notes (Signed)
Pt discharged home.

## 2012-11-22 NOTE — Progress Notes (Signed)
She is here for a routine visit. She was at the MAU for 8 hours last night but never dilated past 4 1/2 cm. She understands labor precautions. She reports good Fm.

## 2012-11-25 ENCOUNTER — Inpatient Hospital Stay (HOSPITAL_COMMUNITY)
Admission: AD | Admit: 2012-11-25 | Discharge: 2012-11-26 | DRG: 373 | Disposition: A | Discharge status: Home / Self Care | Payer: BC Managed Care – PPO | Source: Ambulatory Visit | Attending: Obstetrics & Gynecology | Admitting: Obstetrics & Gynecology

## 2012-11-25 ENCOUNTER — Encounter (HOSPITAL_COMMUNITY): Payer: Self-pay | Admitting: *Deleted

## 2012-11-25 ENCOUNTER — Encounter: Payer: BC Managed Care – PPO | Admitting: Obstetrics & Gynecology

## 2012-11-25 DIAGNOSIS — R768 Other specified abnormal immunological findings in serum: Secondary | ICD-10-CM

## 2012-11-25 DIAGNOSIS — Z3483 Encounter for supervision of other normal pregnancy, third trimester: Secondary | ICD-10-CM

## 2012-11-25 LAB — ABO/RH: ABO/RH(D): O POS

## 2012-11-25 LAB — CBC
HCT: 38.1 % (ref 36.0–46.0)
Hemoglobin: 13.3 g/dL (ref 12.0–15.0)
MCH: 31.2 pg (ref 26.0–34.0)
MCHC: 34.9 g/dL (ref 30.0–36.0)
MCV: 89.4 fL (ref 78.0–100.0)
Platelets: 243 K/uL (ref 150–400)
RBC: 4.26 MIL/uL (ref 3.87–5.11)
RDW: 13.4 % (ref 11.5–15.5)
WBC: 24.4 K/uL — ABNORMAL HIGH (ref 4.0–10.5)

## 2012-11-25 LAB — RPR: RPR Ser Ql: NONREACTIVE

## 2012-11-25 MED ORDER — IBUPROFEN 600 MG PO TABS
600.0000 mg | ORAL_TABLET | Freq: Four times a day (QID) | ORAL | Status: DC
  Administered 2012-11-25 – 2012-11-26 (×4): 600 mg via ORAL
  Filled 2012-11-25 (×3): qty 1

## 2012-11-25 MED ORDER — DIPHENHYDRAMINE HCL 25 MG PO CAPS: 25.0000 mg | ORAL_CAPSULE | Freq: Four times a day (QID) | ORAL | Status: DC | PRN

## 2012-11-25 MED ORDER — LACTATED RINGERS IV SOLN: INTRAVENOUS | Status: DC

## 2012-11-25 MED ORDER — SENNOSIDES-DOCUSATE SODIUM 8.6-50 MG PO TABS
2.0000 | ORAL_TABLET | Freq: Every day | ORAL | Status: DC
Start: 2012-11-25 — End: 2012-11-26
  Administered 2012-11-25: 2 via ORAL

## 2012-11-25 MED ORDER — CITRIC ACID-SODIUM CITRATE 334-500 MG/5ML PO SOLN: 30.0000 mL | ORAL | Status: DC | PRN

## 2012-11-25 MED ORDER — OXYTOCIN BOLUS FROM INFUSION: 500.0000 mL | INTRAVENOUS | Status: DC

## 2012-11-25 MED ORDER — WITCH HAZEL-GLYCERIN EX PADS: 1.0000 "application " | MEDICATED_PAD | CUTANEOUS | Status: DC | PRN

## 2012-11-25 MED ORDER — OXYTOCIN 10 UNIT/ML IJ SOLN
INTRAMUSCULAR | Status: AC
  Administered 2012-11-25: 20 [IU]
  Filled 2012-11-25: qty 1

## 2012-11-25 MED ORDER — ZOLPIDEM TARTRATE 5 MG PO TABS: 5.0000 mg | ORAL_TABLET | Freq: Every evening | ORAL | Status: DC | PRN

## 2012-11-25 MED ORDER — LANOLIN HYDROUS EX OINT: TOPICAL_OINTMENT | CUTANEOUS | Status: DC | PRN

## 2012-11-25 MED ORDER — BENZOCAINE-MENTHOL 20-0.5 % EX AERO
1.0000 "application " | INHALATION_SPRAY | CUTANEOUS | Status: DC | PRN
  Administered 2012-11-25: 1 via TOPICAL
  Filled 2012-11-25: qty 56

## 2012-11-25 MED ORDER — LIDOCAINE HCL (PF) 1 % IJ SOLN
30.0000 mL | INTRAMUSCULAR | Status: DC | PRN
  Filled 2012-11-25: qty 30

## 2012-11-25 MED ORDER — OXYCODONE-ACETAMINOPHEN 5-325 MG PO TABS
1.0000 | ORAL_TABLET | ORAL | Status: DC | PRN
  Administered 2012-11-25: 1 via ORAL
  Filled 2012-11-25: qty 1

## 2012-11-25 MED ORDER — SIMETHICONE 80 MG PO CHEW: 80.0000 mg | CHEWABLE_TABLET | ORAL | Status: DC | PRN

## 2012-11-25 MED ORDER — FLEET ENEMA 7-19 GM/118ML RE ENEM: 1.0000 | ENEMA | RECTAL | Status: DC | PRN

## 2012-11-25 MED ORDER — LACTATED RINGERS IV SOLN: 500.0000 mL | INTRAVENOUS | Status: DC | PRN

## 2012-11-25 MED ORDER — IBUPROFEN 600 MG PO TABS
600.0000 mg | ORAL_TABLET | Freq: Four times a day (QID) | ORAL | Status: DC | PRN
  Filled 2012-11-25: qty 1

## 2012-11-25 MED ORDER — TETANUS-DIPHTH-ACELL PERTUSSIS 5-2.5-18.5 LF-MCG/0.5 IM SUSP: 0.5000 mL | Freq: Once | INTRAMUSCULAR | Status: DC

## 2012-11-25 MED ORDER — OXYTOCIN 40 UNITS IN LACTATED RINGERS INFUSION - SIMPLE MED: 62.5000 mL/h | INTRAVENOUS | Status: DC

## 2012-11-25 MED ORDER — ACETAMINOPHEN 325 MG PO TABS: 650.0000 mg | ORAL_TABLET | ORAL | Status: DC | PRN

## 2012-11-25 MED ORDER — PRENATAL MULTIVITAMIN CH
1.0000 | ORAL_TABLET | Freq: Every day | ORAL | Status: DC
  Administered 2012-11-25: 1 via ORAL
  Filled 2012-11-25: qty 1

## 2012-11-25 MED ORDER — LIDOCAINE HCL (PF) 1 % IJ SOLN
INTRAMUSCULAR | Status: AC
  Filled 2012-11-25: qty 30

## 2012-11-25 MED ORDER — DIBUCAINE 1 % RE OINT: 1.0000 "application " | TOPICAL_OINTMENT | RECTAL | Status: DC | PRN

## 2012-11-25 MED ORDER — ONDANSETRON HCL 4 MG/2ML IJ SOLN: 4.0000 mg | Freq: Four times a day (QID) | INTRAMUSCULAR | Status: DC | PRN

## 2012-11-25 NOTE — MAU Note (Signed)
Pt brought from car to rm 60SVE done-pt 10cm with a BBOW--1 to 0 station-FHT 125

## 2012-11-25 NOTE — H&P (Signed)
Casey Fields is a 32 y.o. female presenting for Labor, advanced Pregnancy has been followed at [facility] and essentially uneventful. Recently moved from [facility].  Maternal Medical History:  Reason for admission: Contractions.  Nausea.  Contractions: Onset was 3-5 hours ago.   Frequency: regular.   Perceived severity is strong.    Fetal activity: Perceived fetal activity is normal.   Last perceived fetal movement was within the past hour.    Prenatal complications: No bleeding.     OB History   Grav Para Term Preterm Abortions TAB SAB Ect Mult Living   2 1 1       1      Past Medical History  Diagnosis Date  . Seasonal allergies   . Abnormal Pap smear age 72    +HPV; Repeats WNL  . Depression 2001    Hx of   . H/O LEEP (loop electrosurgical excision procedure) of cervix complicating pregnancy Age 83   Past Surgical History  Procedure Laterality Date  . Wisdom tooth extraction  2000  . Eye surgery Bilateral 2003    retinal reattachment   Family History: family history includes Hypertension in her father. Social History:  reports that she has never smoked. She has never used smokeless tobacco. She reports that she does not drink alcohol or use illicit drugs.   Prenatal Transfer Tool  Maternal Diabetes: No Genetic Screening: Normal Maternal Ultrasounds/Referrals: Normal Fetal Ultrasounds or other Referrals:  None Maternal Substance Abuse:  No Significant Maternal Medications:  None Significant Maternal Lab Results:  None Other Comments:  None  Review of Systems  Constitutional: Negative for fever and malaise/fatigue.  Gastrointestinal: Positive for abdominal pain. Negative for nausea, vomiting, diarrhea and constipation.  Genitourinary: Negative for dysuria.      Blood pressure 122/82, pulse 88, temperature 97.9 F (36.6 C), temperature source Axillary, resp. rate 18, height 5\' 5"  (1.651 m), weight 206 lb (93.441 kg), last menstrual period 02/28/2012. Maternal  Exam:  Uterine Assessment: Contraction strength is firm.  Contraction frequency is regular.   Abdomen: Fundal height is 38.   Estimated fetal weight is 7.   Fetal presentation: vertex  Introitus: Normal vulva. Vagina is positive for vaginal discharge.  Ferning test: not done.  Nitrazine test: not done. Amniotic fluid character: not assessed.  Pelvis: adequate for delivery.   Cervix: Cervix evaluated by digital exam.     Fetal Exam Fetal Monitor Review: Mode: ultrasound.   Baseline rate: 140.  Variability: moderate (6-25 bpm).   Pattern: no accelerations.    Fetal State Assessment: Category I - tracings are normal.     Physical Exam  Constitutional: She is oriented to person, place, and time. She appears well-developed and well-nourished. No distress.  HENT:  Head: Normocephalic.  Cardiovascular: Normal rate.   Respiratory: Effort normal.  GI: Soft. She exhibits no distension and no mass. There is no tenderness. There is no rebound and no guarding.  Genitourinary: Uterus normal. Vaginal discharge found.  Musculoskeletal: Normal range of motion.  Neurological: She is alert and oriented to person, place, and time.  Skin: Skin is warm and dry.  Psychiatric: She has a normal mood and affect.    Prenatal labs: ABO, Rh: O/Positive/-- (11/04 0000) Antibody: Negative (11/04 0000) Rubella: Immune (11/04 0000) RPR: Reactive (11/04 0000)  HBsAg: Negative (11/04 0000)  HIV: Non-reactive (11/04 0000)  GBS: Negative (04/22 0000)   Assessment/Plan: A:  [facility] at [redacted]w[redacted]d      Active Labor, Second Stage  P:  Admit to [facility]      Prepare for delivery   [facility] 11/25/2012, 6:37 AM

## 2012-11-26 MED ORDER — DOCUSATE SODIUM 100 MG PO CAPS: 100.0000 mg | ORAL_CAPSULE | Freq: Two times a day (BID) | ORAL | Status: DC | PRN

## 2012-11-26 MED ORDER — NORETHINDRONE 0.35 MG PO TABS: 1.0000 | ORAL_TABLET | Freq: Every day | ORAL | Status: DC

## 2012-11-26 MED ORDER — IBUPROFEN 600 MG PO TABS: 600.0000 mg | ORAL_TABLET | Freq: Four times a day (QID) | ORAL | Status: DC | PRN

## 2012-11-26 NOTE — Discharge Summary (Signed)
Obstetric Discharge Summary  Casey Fields is a 32 y.o. [id number] presenting at [redacted]w[redacted]d in active labor. She had a normal SVD with no complications and an unremarkable postpartum course. She is breastfeeding and plans Micronor for birth control until her husband gets a vasectomy.  Reason for Admission: onset of labor Prenatal Procedures: none Intrapartum Procedures: spontaneous vaginal delivery Postpartum Procedures: none Complications-Operative and Postpartum: none Hemoglobin  Date Value Range Status  11/25/2012 13.3  12.0 - 15.0 g/dL Final  45/10/979 19.1   Final     HCT  Date Value Range Status  11/25/2012 38.1  36.0 - 46.0 % Final  05/24/2012 37   Final    Physical Exam:  General: alert, cooperative and no distress Lochia: appropriate Uterine Fundus: firm Incision: n/a DVT Evaluation: No evidence of DVT seen on physical exam. Negative Homan's sign. No cords or calf tenderness. No significant calf/ankle edema.  Discharge Diagnoses: Term Pregnancy-delivered  Discharge Information: Date: 11/26/2012 Activity: pelvic rest Diet: routine Medications: PNV, Ibuprofen and Colace Condition: stable Instructions: refer to practice specific booklet Discharge to: home Follow-up Information   [facility] with [facility] In 6 weeks. (For postpartum check up)    Contact information:   [address] [place] [address] [phone]      Newborn Data: Live born female  Birth Weight: 7 lb 11.3 oz (3495 g) APGAR: 9, 9  Home with mother.  Napoleon Form 11/26/2012, 7:35 AM

## 2012-11-26 NOTE — Progress Notes (Signed)

## 2013-01-06 ENCOUNTER — Ambulatory Visit (INDEPENDENT_AMBULATORY_CARE_PROVIDER_SITE_OTHER): Payer: BC Managed Care – PPO | Admitting: Obstetrics & Gynecology

## 2013-01-06 ENCOUNTER — Inpatient Hospital Stay (HOSPITAL_COMMUNITY): Admission: RE | Admit: 2013-01-06 | Payer: Self-pay | Source: Ambulatory Visit

## 2013-01-06 ENCOUNTER — Encounter: Payer: Self-pay | Admitting: Obstetrics & Gynecology

## 2013-01-06 DIAGNOSIS — Z01419 Encounter for gynecological examination (general) (routine) without abnormal findings: Secondary | ICD-10-CM

## 2013-01-06 DIAGNOSIS — Z124 Encounter for screening for malignant neoplasm of cervix: Secondary | ICD-10-CM

## 2013-01-06 DIAGNOSIS — Z309 Encounter for contraceptive management, unspecified: Secondary | ICD-10-CM

## 2013-01-06 DIAGNOSIS — Z1151 Encounter for screening for human papillomavirus (HPV): Secondary | ICD-10-CM

## 2013-01-06 MED ORDER — NORGESTIMATE-ETH ESTRADIOL 0.25-35 MG-MCG PO TABS: 1.0000 | ORAL_TABLET | Freq: Every day | ORAL | Status: DC

## 2013-01-06 NOTE — Progress Notes (Signed)
  Subjective:     Casey Fields is a 32 y.o. [id number] female who presents for a postpartum visit. She is 6 weeks postpartum following a spontaneous vaginal delivery. I have fully reviewed the prenatal and intrapartum course. The delivery was at [redacted]w[redacted]d gestational weeks.  Postpartum course has been uncomplicated. Baby's course has been uncomplicated. Baby is feeding by breast and bottle. Bleeding: no bleeding. Bowel function is normal, uses occasional stool softeners Bladder function is normal. Patient is not sexually active. Contraception method is oral progesterone-only contraceptive and vasectomy later. Postpartum depression screening: negative.  The following portions of the patient's history were reviewed and updated as appropriate: allergies, current medications, past family history, past medical history, past social history, past surgical history and problem list..  Last pap smear on 10/28/11 was normal.  Review of Systems Pertinent items are noted in HPI.   Objective:    BP 112/72  Pulse 71  Ht 5\' 5"  (1.651 m)  Wt 187 lb (84.823 kg)  BMI 31.12 kg/m2  Breastfeeding? Yes  General:  alert   Breasts:  inspection negative, no nipple discharge or bleeding, no masses or nodularity palpable  Lungs: clear to auscultation bilaterally  Heart:  regular rate and rhythm  Abdomen: soft, non-tender; bowel sounds normal; no masses,  no organomegaly   Vulva:  normal  Vagina: normal vagina, no discharge, exudate, lesion, or erythema  Cervix:  multiparous appearance, no bleeding following Pap and no lesions  Corpus: normal size, contour, position, consistency, mobility, non-tender and retroverted  Adnexa:  normal adnexa and no mass, fullness, tenderness  Rectal Exam: Not performed.        Assessment:     Normal postpartum exam. Pap smear done at today's visit.   Plan:   1. Contraception: OCP (estrogen/progesterone) and vasectomy later 2. Pap done,will follow up results and manage accordingly. 3.  Follow up as needed.  4.  Routine preventative health maintenance measures emphasized

## 2013-05-26 ENCOUNTER — Other Ambulatory Visit: Payer: Self-pay

## 2014-05-22 ENCOUNTER — Encounter: Payer: Self-pay | Admitting: Obstetrics & Gynecology

## 2014-11-30 ENCOUNTER — Encounter: Payer: Self-pay | Admitting: Emergency Medicine

## 2014-11-30 ENCOUNTER — Emergency Department
Admission: EM | Admit: 2014-11-30 | Discharge: 2014-11-30 | Disposition: A | Discharge status: Home / Self Care | Payer: BLUE CROSS/BLUE SHIELD | Source: Home / Self Care | Attending: Emergency Medicine | Admitting: Emergency Medicine

## 2014-11-30 DIAGNOSIS — L255 Unspecified contact dermatitis due to plants, except food: Secondary | ICD-10-CM

## 2014-11-30 MED ORDER — HYDROXYZINE HCL 25 MG PO TABS: ORAL_TABLET | ORAL | Status: DC

## 2014-11-30 MED ORDER — METHYLPREDNISOLONE SODIUM SUCC 125 MG IJ SOLR
125.0000 mg | INTRAMUSCULAR | Status: AC
  Administered 2014-11-30: 125 mg via INTRAMUSCULAR

## 2014-11-30 MED ORDER — TRIAMCINOLONE ACETONIDE 0.1 % EX CREA: TOPICAL_CREAM | CUTANEOUS | Status: DC

## 2014-11-30 MED ORDER — METHYLPREDNISOLONE 4 MG PO TABS: ORAL_TABLET | ORAL | Status: DC

## 2014-11-30 NOTE — Discharge Instructions (Signed)
Poison [employer] Poison ivy is a inflammation of the skin (contact dermatitis) caused by touching the allergens on the leaves of the ivy plant following previous exposure to the plant. The rash usually appears 48 hours after exposure. The rash is usually bumps (papules) or blisters (vesicles) in a linear pattern. Depending on your own sensitivity, the rash may simply cause redness and itching, or it may also progress to blisters which may break open. These must be well cared for to prevent secondary bacterial (germ) infection, followed by scarring. Keep any open areas dry, clean, dressed, and covered with an antibacterial ointment if needed. The eyes may also get puffy. The puffiness is worst in the morning and gets better as the day progresses. This dermatitis usually heals without scarring, within 2 to 3 weeks.  Today, we gave you a strong cortisone-type shot of Solu-Medrol 125 mg to jump start treatment Please see list of prescription medicines sent into your pharmacy. Take the Medrol (methylprednisolone) 4 mg tablets as follows: Days 1 and 2-----6 tablets daily Days 3 and 4- ---5 tablets daily Days 5 and 6---- 4 tablets daily Days 7 and 8---- 3 tablets daily Days 9 and 10 -- 2 tablets daily Days 11 and 12--1 tablet daily HOME CARE INSTRUCTIONS  Aveeno "oatmeal" lukewarm bath May use Zyrtec OTC twice a day for itch. --In addition to the hydroxyzine prescribed for itch Thoroughly wash with soap and water as soon as you have been exposed to poison ivy. You have about one half hour to remove the plant resin before it will cause the rash. This washing will destroy the oil or antigen on the skin that is causing, or will cause, the rash. Be sure to wash under your fingernails as any plant resin there will continue to spread the rash. Do not rub skin vigorously when washing affected area. Poison ivy cannot spread if no oil from the plant remains on your body. A rash that has progressed to weeping sores will not  spread the rash unless you have not washed thoroughly. It is also important to wash any clothes you have been wearing as these may carry active allergens. The rash will return if you wear the unwashed clothing, even several days later. Avoidance of the plant in the future is the best measure. Poison ivy plant can be recognized by the number of leaves. Generally, poison ivy has three leaves with flowering branches on a single stem. Diphenhydramine may be purchased over the counter and used as needed for itching. Do not drive with this medication if it makes you drowsy.Ask your caregiver about medication for children. SEEK MEDICAL CARE IF:  Open sores develop.  Redness spreads beyond area of rash.  You notice purulent (pus-like) discharge.  You have increased pain.  Other signs of infection develop (such as fever). Document Released: 07/04/2000 Document Revised: 09/29/2011 Document Reviewed: 12/15/2008 [facility] Patient Information 2015 [employer] [place]. This information is not intended to replace advice given to you by your health care provider. Make sure you discuss any questions you have with your health care provider.

## 2014-11-30 NOTE — ED Provider Notes (Signed)
CSN: [id number]     Arrival date & time 11/30/14  1331 History   First MD Initiated Contact with Patient 11/30/14 1348     Chief Complaint  Patient presents with  . Rash    Patient is a 34 y.o. female presenting with rash. presenting with rash. The history is provided by the patient.  Rash Location:  Face, head/neck, torso, leg, hand and shoulder/arm Quality: blistering, itchiness, redness and swelling   Severity:  Severe Duration:  3 days Progression:  Worsening Chronicity:  New Context comment:  Did major yard work 12 days ago, pulling up weeds and vines Relieved by:  Nothing Ineffective treatments: Started prednisone 10 [facility]. Associated symptoms: no abdominal pain, no diarrhea, no fever, no headaches, no nausea, no shortness of breath, no sore throat, no throat swelling, no tongue swelling, not vomiting and not wheezing     Past Medical History  Diagnosis Date  . Seasonal allergies   . Abnormal Pap smear age 34    +HPV; Repeats WNL  . Depression 2001    Hx of   . H/O LEEP (loop electrosurgical excision procedure) of cervix complicating pregnancy Age 34   Past Surgical History  Procedure Laterality Date  . Wisdom tooth extraction  2000  . Eye surgery Bilateral 2003    retinal reattachment   Family History  Problem Relation Age of Onset  . Hypertension Father    History  Substance Use Topics  . Smoking status: Never Smoker   . Smokeless tobacco: Never Used  . Alcohol Use: No   OB History    Gravida Para Term Preterm AB TAB SAB Ectopic Multiple Living   2 2 2       2      Review of Systems  Constitutional: Negative for fever.  HENT: Negative for sore throat.   Respiratory: Negative for shortness of breath and wheezing.   Gastrointestinal: Negative for nausea, vomiting, abdominal pain and diarrhea.  Skin: Positive for rash.  Neurological: Negative for headaches.  All other systems reviewed and are negative.   Allergies  Penicillins  Home  Medications   Prior to Admission medications   Medication Sig Start Date End Date Taking? Authorizing Provider  cetirizine (ZYRTEC) 5 MG tablet Take 5 mg by mouth daily.    Historical Provider, MD  docusate sodium (COLACE) 100 MG capsule Take 1 capsule (100 mg total) by mouth 2 (two) times daily as needed for constipation. 11/26/12   Napoleon FormPamela Ferry, MD  hydrOXYzine (ATARAX/VISTARIL) 25 MG tablet Take 1 every 4-6 hours as needed for itch. (Caution: May cause drowsiness) 11/30/14   Lajean Manesavid Massey, MD  ibuprofen (ADVIL,MOTRIN) 600 MG tablet Take 1 tablet (600 mg total) by mouth every 6 (six) hours as needed (pain scale < 4). 11/26/12   Napoleon FormPamela Ferry, MD  methylPREDNISolone (MEDROL) 4 MG tablet Take tapering dose as directed over 12 days. 11/30/14   Lajean Manesavid Massey, MD  Multiple Vitamin (MULTIVITAMIN) capsule Take 1 capsule by mouth daily.    Historical Provider, MD  norgestimate-ethinyl estradiol (ORTHO-CYCLEN,SPRINTEC,PREVIFEM) 0.25-35 MG-MCG tablet Take 1 tablet by mouth daily. 01/06/13   Tereso NewcomerUgonna A Anyanwu, MD  triamcinolone cream (KENALOG) 0.1 % Apply to affected areas 2 or 3 times daily as directed. 11/30/14   Lajean Manesavid Massey, MD   BP 117/76 mmHg  Pulse 87  Temp(Src) 98.4 F (36.9 C) (Oral)  Resp 16  Ht 5\' 5"  (1.651 m)  Wt 178 lb (80.74 kg)  BMI 29.62 kg/m2  SpO2 96%  LMP 11/17/2014 (Approximate) Physical Exam  Constitutional: She is oriented to person, place, and time. She appears well-developed and well-nourished. No distress.  Uncomfortable from pruritic diffuse poison ivy rash  HENT:  Head: Normocephalic and atraumatic.  Eyes: Conjunctivae and EOM are normal. Pupils are equal, round, and reactive to light. No scleral icterus.  Neck: Normal range of motion.  Cardiovascular: Normal rate.   Pulmonary/Chest: Effort normal.  Abdominal: She exhibits no distension.  Musculoskeletal: Normal range of motion.  Neurological: She is alert and oriented to person, place, and time.  Skin: Skin is warm. Rash  noted.  Diffuse erythematous rash, face neck trunk abdomen bilateral upper and lower extremities. Some clusters, some streaks. Few scattered vesicles.--Consistent with poison ivy or poison oak rash. No fluctuance or sign of bacterial infection.  Psychiatric: She has a normal mood and affect.  Nursing note and vitals reviewed.  Oropharynx and oral airway normal. Lungs clear without wheezing ED Course  Procedures (including critical care time) Labs Review Labs Reviewed - No data to display  Imaging Review No results found.   MDM   1. Dermatitis due to plants, including poison ivy, sumac, and oak   Severe  Treatment options discussed, as well as risks, benefits, alternatives. Patient voiced understanding and agreement with the following plans: Solu-Medrol 125 IM stat Discharge Medication List as of 11/30/2014  2:27 PM    START taking these medications   Details  hydrOXYzine (ATARAX/VISTARIL) 25 MG tablet Take 1 every 4-6 hours as needed for itch. (Caution: May cause drowsiness), Normal    methylPREDNISolone (MEDROL) 4 MG tablet Take tapering dose as directed over 12 days., Normal    triamcinolone cream (KENALOG) 0.1 % Apply to affected areas 2 or 3 times daily as directed., Normal       See detailed Instructions in AVS, which were given to patient. Verbal instructions also given. Risks, benefits, and alternatives of treatment options discussed. Questions invited and answered. Patient voiced understanding and agreement with plans.      Lajean Manesavid Massey, MD 11/30/14 1455

## 2014-11-30 NOTE — ED Notes (Signed)
Patient came in contact with known poison ivy 12 days ago; no reaction until 2 days ago; went to [facility] yesterday and started on Prednisone which seems to have made it worse.

## 2015-02-13 ENCOUNTER — Ambulatory Visit: Payer: BLUE CROSS/BLUE SHIELD | Admitting: Obstetrics & Gynecology

## 2015-11-27 ENCOUNTER — Encounter: Payer: Self-pay | Admitting: Family Medicine

## 2015-11-27 ENCOUNTER — Ambulatory Visit (INDEPENDENT_AMBULATORY_CARE_PROVIDER_SITE_OTHER): Payer: BLUE CROSS/BLUE SHIELD | Admitting: Family Medicine

## 2015-11-27 VITALS — BP 132/94 | HR 80 | Resp 16 | Ht 65.0 in | Wt 188.0 lb

## 2015-11-27 DIAGNOSIS — Z01419 Encounter for gynecological examination (general) (routine) without abnormal findings: Secondary | ICD-10-CM | POA: Diagnosis not present

## 2015-11-27 DIAGNOSIS — Z1151 Encounter for screening for human papillomavirus (HPV): Secondary | ICD-10-CM

## 2015-11-27 DIAGNOSIS — Z124 Encounter for screening for malignant neoplasm of cervix: Secondary | ICD-10-CM

## 2015-11-27 NOTE — Patient Instructions (Signed)
Preventive Care for Adults, Female A healthy lifestyle and preventive care can promote health and wellness. Preventive health guidelines for women include the following key practices.  A routine yearly physical is a good way to check with your health care provider about your health and preventive screening. It is a chance to share any concerns and updates on your health and to receive a thorough exam.  Visit your dentist for a routine exam and preventive care every 6 months. Brush your teeth twice a day and floss once a day. Good oral hygiene prevents tooth decay and gum disease.  The frequency of eye exams is based on your age, health, family medical history, use of contact lenses, and other factors. Follow your health care provider's recommendations for frequency of eye exams.  Eat a healthy diet. Foods like vegetables, fruits, whole grains, low-fat dairy products, and lean protein foods contain the nutrients you need without too many calories. Decrease your intake of foods high in solid fats, added sugars, and salt. Eat the right amount of calories for you.Get information about a proper diet from your health care provider, if necessary.  Regular physical exercise is one of the most important things you can do for your health. Most adults should get at least 150 minutes of moderate-intensity exercise (any activity that increases your heart rate and causes you to sweat) each week. In addition, most adults need muscle-strengthening exercises on 2 or more days a week.  Maintain a healthy weight. The body mass index (BMI) is a screening tool to identify possible weight problems. It provides an estimate of body fat based on height and weight. Your health care provider can find your BMI and can help you achieve or maintain a healthy weight.For adults 20 years and older:  A BMI below 18.5 is considered underweight.  A BMI of 18.5 to 24.9 is normal.  A BMI of 25 to 29.9 is considered overweight.  A  BMI of 30 and above is considered obese.  Maintain normal blood lipids and cholesterol levels by exercising and minimizing your intake of saturated fat. Eat a balanced diet with plenty of fruit and vegetables. Blood tests for lipids and cholesterol should begin at age 59 and be repeated every 5 years. If your lipid or cholesterol levels are high, you are over 50, or you are at high risk for heart disease, you may need your cholesterol levels checked more frequently.Ongoing high lipid and cholesterol levels should be treated with medicines if diet and exercise are not working.  If you smoke, find out from your health care provider how to quit. If you do not use tobacco, do not start.  Lung cancer screening is recommended for adults aged 60-80 years who are at high risk for developing lung cancer because of a history of smoking. A yearly low-dose CT scan of the lungs is recommended for people who have at least a 30-pack-year history of smoking and are a current smoker or have quit within the past 15 years. A pack year of smoking is smoking an average of 1 pack of cigarettes a day for 1 year (for example: 1 pack a day for 30 years or 2 packs a day for 15 years). Yearly screening should continue until the smoker has stopped smoking for at least 15 years. Yearly screening should be stopped for people who develop a health problem that would prevent them from having lung cancer treatment.  If you are pregnant, do not drink alcohol. If you are  breastfeeding, be very cautious about drinking alcohol. If you are not pregnant and choose to drink alcohol, do not have more than 1 drink per day. One drink is considered to be 12 ounces (355 mL) of beer, 5 ounces (148 mL) of wine, or 1.5 ounces (44 mL) of liquor.  Avoid use of street drugs. Do not share needles with anyone. Ask for help if you need support or instructions about stopping the use of drugs.  High blood pressure causes heart disease and increases the risk  of stroke. Your blood pressure should be checked at least every 1 to 2 years. Ongoing high blood pressure should be treated with medicines if weight loss and exercise do not work.  If you are 72-63 years old, ask your health care provider if you should take aspirin to prevent strokes.  Diabetes screening is done by taking a blood sample to check your blood glucose level after you have not eaten for a certain period of time (fasting). If you are not overweight and you do not have risk factors for diabetes, you should be screened once every 3 years starting at age 56. If you are overweight or obese and you are 43-77 years of age, you should be screened for diabetes every year as part of your cardiovascular risk assessment.  Breast cancer screening is essential preventive care for women. You should practice "breast self-awareness." This means understanding the normal appearance and feel of your breasts and may include breast self-examination. Any changes detected, no matter how small, should be reported to a health care provider. Women in their 43s and 30s should have a clinical breast exam (CBE) by a health care provider as part of a regular health exam every 1 to 3 years. After age 35, women should have a CBE every year. Starting at age 14, women should consider having a mammogram (breast X-ray test) every year. Women who have a family history of breast cancer should talk to their health care provider about genetic screening. Women at a high risk of breast cancer should talk to their health care providers about having an MRI and a mammogram every year.  Breast cancer gene (BRCA)-related cancer risk assessment is recommended for women who have family members with BRCA-related cancers. BRCA-related cancers include breast, ovarian, tubal, and peritoneal cancers. Having family members with these cancers may be associated with an increased risk for harmful changes (mutations) in the breast cancer genes BRCA1 and  BRCA2. Results of the assessment will determine the need for genetic counseling and BRCA1 and BRCA2 testing.  Your health care provider may recommend that you be screened regularly for cancer of the pelvic organs (ovaries, uterus, and vagina). This screening involves a pelvic examination, including checking for microscopic changes to the surface of your cervix (Pap test). You may be encouraged to have this screening done every 3 years, beginning at age 7.  For women ages 8-65, health care providers may recommend pelvic exams and Pap testing every 3 years, or they may recommend the Pap and pelvic exam, combined with testing for human papilloma virus (HPV), every 5 years. Some types of HPV increase your risk of cervical cancer. Testing for HPV may also be done on women of any age with unclear Pap test results.  Other health care providers may not recommend any screening for nonpregnant women who are considered low risk for pelvic cancer and who do not have symptoms. Ask your health care provider if a screening pelvic exam is right for  you.  If you have had past treatment for cervical cancer or a condition that could lead to cancer, you need Pap tests and screening for cancer for at least 20 years after your treatment. If Pap tests have been discontinued, your risk factors (such as having a new sexual partner) need to be reassessed to determine if screening should resume. Some women have medical problems that increase the chance of getting cervical cancer. In these cases, your health care provider may recommend more frequent screening and Pap tests.  Colorectal cancer can be detected and often prevented. Most routine colorectal cancer screening begins at the age of 50 years and continues through age 75 years. However, your health care provider may recommend screening at an earlier age if you have risk factors for colon cancer. On a yearly basis, your health care provider may provide home test kits to check  for hidden blood in the stool. Use of a small camera at the end of a tube, to directly examine the colon (sigmoidoscopy or colonoscopy), can detect the earliest forms of colorectal cancer. Talk to your health care provider about this at age 50, when routine screening begins. Direct exam of the colon should be repeated every 5-10 years through age 75 years, unless early forms of precancerous polyps or small growths are found.  People who are at an increased risk for hepatitis B should be screened for this virus. You are considered at high risk for hepatitis B if:  You were born in a country where hepatitis B occurs often. Talk with your health care provider about which countries are considered high risk.  Your parents were born in a high-risk country and you have not received a shot to protect against hepatitis B (hepatitis B vaccine).  You have HIV or AIDS.  You use needles to inject street drugs.  You live with, or have sex with, someone who has hepatitis B.  You get hemodialysis treatment.  You take certain medicines for conditions like cancer, organ transplantation, and autoimmune conditions.  Hepatitis C blood testing is recommended for all people born from 1945 through 1965 and any individual with known risks for hepatitis C.  Practice safe sex. Use condoms and avoid high-risk sexual practices to reduce the spread of sexually transmitted infections (STIs). STIs include gonorrhea, chlamydia, syphilis, trichomonas, herpes, HPV, and human immunodeficiency virus (HIV). Herpes, HIV, and HPV are viral illnesses that have no cure. They can result in disability, cancer, and death.  You should be screened for sexually transmitted illnesses (STIs) including gonorrhea and chlamydia if:  You are sexually active and are younger than 24 years.  You are older than 24 years and your health care provider tells you that you are at risk for this type of infection.  Your sexual activity has changed  since you were last screened and you are at an increased risk for chlamydia or gonorrhea. Ask your health care provider if you are at risk.  If you are at risk of being infected with HIV, it is recommended that you take a prescription medicine daily to prevent HIV infection. This is called preexposure prophylaxis (PrEP). You are considered at risk if:  You are sexually active and do not regularly use condoms or know the HIV status of your partner(s).  You take drugs by injection.  You are sexually active with a partner who has HIV.  Talk with your health care provider about whether you are at high risk of being infected with HIV. If   you choose to begin PrEP, you should first be tested for HIV. You should then be tested every 3 months for as long as you are taking PrEP.  Osteoporosis is a disease in which the bones lose minerals and strength with aging. This can result in serious bone fractures or breaks. The risk of osteoporosis can be identified using a bone density scan. Women ages 68 years and over and women at risk for fractures or osteoporosis should discuss screening with their health care providers. Ask your health care provider whether you should take a calcium supplement or vitamin D to reduce the rate of osteoporosis.  Menopause can be associated with physical symptoms and risks. Hormone replacement therapy is available to decrease symptoms and risks. You should talk to your health care provider about whether hormone replacement therapy is right for you.  Use sunscreen. Apply sunscreen liberally and repeatedly throughout the day. You should seek shade when your shadow is shorter than you. Protect yourself by wearing long sleeves, pants, a wide-brimmed hat, and sunglasses year round, whenever you are outdoors.  Once a month, do a whole body skin exam, using a mirror to look at the skin on your back. Tell your health care provider of new moles, moles that have irregular borders, moles that  are larger than a pencil eraser, or moles that have changed in shape or color.  Stay current with required vaccines (immunizations).  Influenza vaccine. All adults should be immunized every year.  Tetanus, diphtheria, and acellular pertussis (Td, Tdap) vaccine. Pregnant women should receive 1 dose of Tdap vaccine during each pregnancy. The dose should be obtained regardless of the length of time since the last dose. Immunization is preferred during the 27th-36th week of gestation. An adult who has not previously received Tdap or who does not know her vaccine status should receive 1 dose of Tdap. This initial dose should be followed by tetanus and diphtheria toxoids (Td) booster doses every 10 years. Adults with an unknown or incomplete history of completing a 3-dose immunization series with Td-containing vaccines should begin or complete a primary immunization series including a Tdap dose. Adults should receive a Td booster every 10 years.  Varicella vaccine. An adult without evidence of immunity to varicella should receive 2 doses or a second dose if she has previously received 1 dose. Pregnant females who do not have evidence of immunity should receive the first dose after pregnancy. This first dose should be obtained before leaving the health care facility. The second dose should be obtained 4-8 weeks after the first dose.  Human papillomavirus (HPV) vaccine. Females aged 13-26 years who have not received the vaccine previously should obtain the 3-dose series. The vaccine is not recommended for use in pregnant females. However, pregnancy testing is not needed before receiving a dose. If a female is found to be pregnant after receiving a dose, no treatment is needed. In that case, the remaining doses should be delayed until after the pregnancy. Immunization is recommended for any person with an immunocompromised condition through the age of 53 years if she did not get any or all doses earlier. During the  3-dose series, the second dose should be obtained 4-8 weeks after the first dose. The third dose should be obtained 24 weeks after the first dose and 16 weeks after the second dose.  Zoster vaccine. One dose is recommended for adults aged 53 years or older unless certain conditions are present.  Measles, mumps, and rubella (MMR) vaccine. Adults born  before 1957 generally are considered immune to measles and mumps. Adults born in [birth year] or later should have 1 or more doses of MMR vaccine unless there is a contraindication to the vaccine or there is laboratory evidence of immunity to each of the three diseases. A routine second dose of MMR vaccine should be obtained at least 28 days after the first dose for students attending postsecondary schools, health care workers, or international travelers. People who received inactivated measles vaccine or an unknown type of measles vaccine during 1963-1967 should receive 2 doses of MMR vaccine. People who received inactivated mumps vaccine or an unknown type of mumps vaccine before 1979 and are at high risk for mumps infection should consider immunization with 2 doses of MMR vaccine. For females of childbearing age, rubella immunity should be determined. If there is no evidence of immunity, females who are not pregnant should be vaccinated. If there is no evidence of immunity, females who are pregnant should delay immunization until after pregnancy. Unvaccinated health care workers born before [birth year] who lack laboratory evidence of measles, mumps, or rubella immunity or laboratory confirmation of disease should consider measles and mumps immunization with 2 doses of MMR vaccine or rubella immunization with 1 dose of MMR vaccine.  Pneumococcal 13-valent conjugate (PCV13) vaccine. When indicated, a person who is uncertain of his immunization history and has no record of immunization should receive the PCV13 vaccine. All adults 57 years of age and older should receive this  vaccine. An adult aged 65 years or older who has certain medical conditions and has not been previously immunized should receive 1 dose of PCV13 vaccine. This PCV13 should be followed with a dose of pneumococcal polysaccharide (PPSV23) vaccine. Adults who are at high risk for pneumococcal disease should obtain the PPSV23 vaccine at least 8 weeks after the dose of PCV13 vaccine. Adults older than 36 years of age who have normal immune system function should obtain the PPSV23 vaccine dose at least 1 year after the dose of PCV13 vaccine.  Pneumococcal polysaccharide (PPSV23) vaccine. When PCV13 is also indicated, PCV13 should be obtained first. All adults aged 4 years and older should be immunized. An adult younger than age 25 years who has certain medical conditions should be immunized. Any person who resides in a nursing home or long-term care facility should be immunized. An adult smoker should be immunized. People with an immunocompromised condition and certain other conditions should receive both PCV13 and PPSV23 vaccines. People with human immunodeficiency virus (HIV) infection should be immunized as soon as possible after diagnosis. Immunization during chemotherapy or radiation therapy should be avoided. Routine use of PPSV23 vaccine is not recommended for American Indians, [place] Natives, or people younger than 65 years unless there are medical conditions that require PPSV23 vaccine. When indicated, people who have unknown immunization and have no record of immunization should receive PPSV23 vaccine. One-time revaccination 5 years after the first dose of PPSV23 is recommended for people aged 19-64 years who have chronic kidney failure, nephrotic syndrome, asplenia, or immunocompromised conditions. People who received 1-2 doses of PPSV23 before age 51 years should receive another dose of PPSV23 vaccine at age 62 years or later if at least 5 years have passed since the previous dose. Doses of PPSV23 are not  needed for people immunized with PPSV23 at or after age 3 years.  Meningococcal vaccine. Adults with asplenia or persistent complement component deficiencies should receive 2 doses of quadrivalent meningococcal conjugate (MenACWY-D) vaccine. The doses should be obtained  at least 2 months apart. Microbiologists working with certain meningococcal bacteria, [place] recruits, people at risk during an outbreak, and people who travel to or live in countries with a high rate of meningitis should be immunized. A first-year college student up through age 34 years who is living in a residence hall should receive a dose if she did not receive a dose on or after her 16th birthday. Adults who have certain high-risk conditions should receive one or more doses of vaccine.  Hepatitis A vaccine. Adults who wish to be protected from this disease, have certain high-risk conditions, work with hepatitis A-infected animals, work in hepatitis A research labs, or travel to or work in countries with a high rate of hepatitis A should be immunized. Adults who were previously unvaccinated and who anticipate close contact with an international adoptee during the first 60 days after arrival in the [place] from a country with a high rate of hepatitis A should be immunized.  Hepatitis B vaccine. Adults who wish to be protected from this disease, have certain high-risk conditions, may be exposed to blood or other infectious body fluids, are household contacts or sex partners of hepatitis B positive people, are clients or workers in certain care facilities, or travel to or work in countries with a high rate of hepatitis B should be immunized.  Haemophilus influenzae type b (Hib) vaccine. A previously unvaccinated person with asplenia or sickle cell disease or having a scheduled splenectomy should receive 1 dose of Hib vaccine. Regardless of previous immunization, a recipient of a hematopoietic stem cell transplant should receive a  3-dose series 6-12 months after her successful transplant. Hib vaccine is not recommended for adults with HIV infection. Preventive Services / Frequency Ages 35 to 4 years  Blood pressure check.** / Every 3-5 years.  Lipid and cholesterol check.** / Every 5 years beginning at age 60.  Clinical breast exam.** / Every 3 years for women in their 71s and 10s.  BRCA-related cancer risk assessment.** / For women who have family members with a BRCA-related cancer (breast, ovarian, tubal, or peritoneal cancers).  Pap test.** / Every 2 years from ages 76 through 26. Every 3 years starting at age 61 through age 76 or 93 with a history of 3 consecutive normal Pap tests.  HPV screening.** / Every 3 years from ages 37 through ages 60 to 51 with a history of 3 consecutive normal Pap tests.  Hepatitis C blood test.** / For any individual with known risks for hepatitis C.  Skin self-exam. / Monthly.  Influenza vaccine. / Every year.  Tetanus, diphtheria, and acellular pertussis (Tdap, Td) vaccine.** / Consult your health care provider. Pregnant women should receive 1 dose of Tdap vaccine during each pregnancy. 1 dose of Td every 10 years.  Varicella vaccine.** / Consult your health care provider. Pregnant females who do not have evidence of immunity should receive the first dose after pregnancy.  HPV vaccine. / 3 doses over 6 months, if 93 and younger. The vaccine is not recommended for use in pregnant females. However, pregnancy testing is not needed before receiving a dose.  Measles, mumps, rubella (MMR) vaccine.** / You need at least 1 dose of MMR if you were born in 1957 or later. You may also need a 2nd dose. For females of childbearing age, rubella immunity should be determined. If there is no evidence of immunity, females who are not pregnant should be vaccinated. If there is no evidence of immunity, females who are  pregnant should delay immunization until after pregnancy.  Pneumococcal  13-valent conjugate (PCV13) vaccine.** / Consult your health care provider.  Pneumococcal polysaccharide (PPSV23) vaccine.** / 1 to 2 doses if you smoke cigarettes or if you have certain conditions.  Meningococcal vaccine.** / 1 dose if you are age 68 to 8 years and a Market researcher living in a residence hall, or have one of several medical conditions, you need to get vaccinated against meningococcal disease. You may also need additional booster doses.  Hepatitis A vaccine.** / Consult your health care provider.  Hepatitis B vaccine.** / Consult your health care provider.  Haemophilus influenzae type b (Hib) vaccine.** / Consult your health care provider. Ages 7 to 53 years  Blood pressure check.** / Every year.  Lipid and cholesterol check.** / Every 5 years beginning at age 25 years.  Lung cancer screening. / Every year if you are aged 11-80 years and have a 30-pack-year history of smoking and currently smoke or have quit within the past 15 years. Yearly screening is stopped once you have quit smoking for at least 15 years or develop a health problem that would prevent you from having lung cancer treatment.  Clinical breast exam.** / Every year after age 48 years.  BRCA-related cancer risk assessment.** / For women who have family members with a BRCA-related cancer (breast, ovarian, tubal, or peritoneal cancers).  Mammogram.** / Every year beginning at age 41 years and continuing for as long as you are in good health. Consult with your health care provider.  Pap test.** / Every 3 years starting at age 65 years through age 37 or 70 years with a history of 3 consecutive normal Pap tests.  HPV screening.** / Every 3 years from ages 72 years through ages 60 to 40 years with a history of 3 consecutive normal Pap tests.  Fecal occult blood test (FOBT) of stool. / Every year beginning at age 21 years and continuing until age 5 years. You may not need to do this test if you get  a colonoscopy every 10 years.  Flexible sigmoidoscopy or colonoscopy.** / Every 5 years for a flexible sigmoidoscopy or every 10 years for a colonoscopy beginning at age 35 years and continuing until age 48 years.  Hepatitis C blood test.** / For all people born from [birth year] through 1965 and any individual with known risks for hepatitis C.  Skin self-exam. / Monthly.  Influenza vaccine. / Every year.  Tetanus, diphtheria, and acellular pertussis (Tdap/Td) vaccine.** / Consult your health care provider. Pregnant women should receive 1 dose of Tdap vaccine during each pregnancy. 1 dose of Td every 10 years.  Varicella vaccine.** / Consult your health care provider. Pregnant females who do not have evidence of immunity should receive the first dose after pregnancy.  Zoster vaccine.** / 1 dose for adults aged 30 years or older.  Measles, mumps, rubella (MMR) vaccine.** / You need at least 1 dose of MMR if you were born in 1957 or later. You may also need a second dose. For females of childbearing age, rubella immunity should be determined. If there is no evidence of immunity, females who are not pregnant should be vaccinated. If there is no evidence of immunity, females who are pregnant should delay immunization until after pregnancy.  Pneumococcal 13-valent conjugate (PCV13) vaccine.** / Consult your health care provider.  Pneumococcal polysaccharide (PPSV23) vaccine.** / 1 to 2 doses if you smoke cigarettes or if you have certain conditions.  Meningococcal vaccine.** /  Consult your health care provider.  Hepatitis A vaccine.** / Consult your health care provider.  Hepatitis B vaccine.** / Consult your health care provider.  Haemophilus influenzae type b (Hib) vaccine.** / Consult your health care provider. Ages 64 years and over  Blood pressure check.** / Every year.  Lipid and cholesterol check.** / Every 5 years beginning at age 23 years.  Lung cancer screening. / Every year if you  are aged 16-80 years and have a 30-pack-year history of smoking and currently smoke or have quit within the past 15 years. Yearly screening is stopped once you have quit smoking for at least 15 years or develop a health problem that would prevent you from having lung cancer treatment.  Clinical breast exam.** / Every year after age 74 years.  BRCA-related cancer risk assessment.** / For women who have family members with a BRCA-related cancer (breast, ovarian, tubal, or peritoneal cancers).  Mammogram.** / Every year beginning at age 44 years and continuing for as long as you are in good health. Consult with your health care provider.  Pap test.** / Every 3 years starting at age 58 years through age 22 or 39 years with 3 consecutive normal Pap tests. Testing can be stopped between 65 and 70 years with 3 consecutive normal Pap tests and no abnormal Pap or HPV tests in the past 10 years.  HPV screening.** / Every 3 years from ages 64 years through ages 70 or 61 years with a history of 3 consecutive normal Pap tests. Testing can be stopped between 65 and 70 years with 3 consecutive normal Pap tests and no abnormal Pap or HPV tests in the past 10 years.  Fecal occult blood test (FOBT) of stool. / Every year beginning at age 40 years and continuing until age 27 years. You may not need to do this test if you get a colonoscopy every 10 years.  Flexible sigmoidoscopy or colonoscopy.** / Every 5 years for a flexible sigmoidoscopy or every 10 years for a colonoscopy beginning at age 7 years and continuing until age 32 years.  Hepatitis C blood test.** / For all people born from [birth year] through 1966 and any individual with known risks for hepatitis C.  Osteoporosis screening.** / A one-time screening for women ages 30 years and over and women at risk for fractures or osteoporosis.  Skin self-exam. / Monthly.  Influenza vaccine. / Every year.  Tetanus, diphtheria, and acellular pertussis (Tdap/Td)  vaccine.** / 1 dose of Td every 10 years.  Varicella vaccine.** / Consult your health care provider.  Zoster vaccine.** / 1 dose for adults aged 35 years or older.  Pneumococcal 13-valent conjugate (PCV13) vaccine.** / Consult your health care provider.  Pneumococcal polysaccharide (PPSV23) vaccine.** / 1 dose for all adults aged 46 years and older.  Meningococcal vaccine.** / Consult your health care provider.  Hepatitis A vaccine.** / Consult your health care provider.  Hepatitis B vaccine.** / Consult your health care provider.  Haemophilus influenzae type b (Hib) vaccine.** / Consult your health care provider. ** Family history and personal history of risk and conditions may change your health care provider's recommendations.   This information is not intended to replace advice given to you by your health care provider. Make sure you discuss any questions you have with your health care provider.   Document Released: 09/02/2001 Document Revised: 07/28/2014 Document Reviewed: 12/02/2010 Elsevier Interactive Patient Education [employer].

## 2015-11-27 NOTE — Progress Notes (Signed)
  Subjective:     Casey Fields is a 35 y.o. female and is here for a comprehensive physical exam. The patient reports no problems. Has regular menses. Working in [employer]. Husband with vasectomy.  Social History   Social History  . Marital Status: Married    Spouse Name: N/A  . Number of Children: N/A  . Years of Education: N/A   Occupational History  . Not on file.   Social History Main Topics  . Smoking status: Never Smoker   . Smokeless tobacco: Never Used  . Alcohol Use: 2.4 oz/week    4 Standard drinks or equivalent per week  . Drug Use: No  . Sexual Activity:    Partners: Male   Other Topics Concern  . Not on file   Social History Narrative   Health Maintenance  Topic Date Due  . Janet BerlinETANUS/TDAP  06/07/1999  . PAP SMEAR  01/07/2016  . INFLUENZA VACCINE  02/19/2016  . HIV Screening  Completed    The following portions of the patient's history were reviewed and updated as appropriate: allergies, current medications, past family history, past medical history, past social history, past surgical history and problem list.  Review of Systems Pertinent items noted in HPI and remainder of comprehensive ROS otherwise negative.   Objective:    BP 132/94 mmHg  Pulse 80  Resp 16  Ht 5\' 5"  (1.651 m)  Wt 188 lb (85.276 kg)  BMI 31.28 kg/m2  LMP 11/21/2015  Breastfeeding? No General appearance: alert, cooperative and appears stated age Head: Normocephalic, without obvious abnormality, atraumatic Neck: no adenopathy, supple, symmetrical, trachea midline and thyroid not enlarged, symmetric, no tenderness/mass/nodules Lungs: clear to auscultation bilaterally Breasts: normal appearance, no masses or tenderness Heart: regular rate and rhythm, S1, S2 normal, no murmur, click, rub or gallop Abdomen: soft, non-tender; bowel sounds normal; no masses,  no organomegaly Pelvic: cervix normal in appearance, external genitalia normal, no adnexal masses or tenderness, no cervical  motion tenderness, uterus normal size, shape, and consistency and vagina normal without discharge Extremities: extremities normal, atraumatic, no cyanosis or edema Pulses: 2+ and symmetric Skin: Skin color, texture, turgor normal. No rashes or lesions Lymph nodes: Cervical, supraclavicular, and axillary nodes normal. Neurologic: Grossly normal    Assessment:    Healthy female exam.      Plan:     1. Screening for malignant neoplasm of cervix  - Cytology - PAP  2. Encounter for routine gynecological examination Anticipatory guidance given - CBC; Future - Comprehensive metabolic panel; Future - TSH; Future - Lipid panel; Future  See After Visit Summary for Counseling Recommendations

## 2015-11-29 LAB — CYTOLOGY - PAP

## 2015-12-06 ENCOUNTER — Other Ambulatory Visit (INDEPENDENT_AMBULATORY_CARE_PROVIDER_SITE_OTHER): Payer: BLUE CROSS/BLUE SHIELD | Admitting: *Deleted

## 2015-12-06 DIAGNOSIS — Z01419 Encounter for gynecological examination (general) (routine) without abnormal findings: Secondary | ICD-10-CM

## 2015-12-06 LAB — CBC
HEMATOCRIT: 38.5 % (ref 35.0–45.0)
HEMOGLOBIN: 12.5 g/dL (ref 11.7–15.5)
MCH: 29.7 pg (ref 27.0–33.0)
MCHC: 32.5 g/dL (ref 32.0–36.0)
MCV: 91.4 fL (ref 80.0–100.0)
MPV: 9.7 fL (ref 7.5–12.5)
Platelets: 197 10*3/uL (ref 140–400)
RBC: 4.21 MIL/uL (ref 3.80–5.10)
RDW: 12.6 % (ref 11.0–15.0)
WBC: 5.5 10*3/uL (ref 3.8–10.8)

## 2015-12-06 LAB — COMPREHENSIVE METABOLIC PANEL
ALBUMIN: 4.2 g/dL (ref 3.6–5.1)
ALT: 7 U/L (ref 6–29)
AST: 11 U/L (ref 10–30)
Alkaline Phosphatase: 41 U/L (ref 33–115)
BUN: 13 mg/dL (ref 7–25)
CALCIUM: 8.8 mg/dL (ref 8.6–10.2)
CHLORIDE: 105 mmol/L (ref 98–110)
CO2: 26 mmol/L (ref 20–31)
Creat: 0.98 mg/dL (ref 0.50–1.10)
Glucose, Bld: 85 mg/dL (ref 65–99)
Potassium: 4.3 mmol/L (ref 3.5–5.3)
SODIUM: 138 mmol/L (ref 135–146)
TOTAL PROTEIN: 6.3 g/dL (ref 6.1–8.1)
Total Bilirubin: 0.4 mg/dL (ref 0.2–1.2)

## 2015-12-06 LAB — TSH: TSH: 1.64 m[IU]/L

## 2015-12-06 LAB — LIPID PANEL
CHOL/HDL RATIO: 3.1 ratio (ref <=5.0)
CHOLESTEROL: 177 mg/dL (ref 125–200)
HDL: 58 mg/dL (ref >=46)
LDL Cholesterol: 102 mg/dL (ref <130)
TRIGLYCERIDES: 86 mg/dL (ref <150)
VLDL: 17 mg/dL (ref <30)

## 2015-12-06 NOTE — Addendum Note (Signed)
Addended by: Gita KudoLASSITER, Eunice Oldaker S on: 12/06/2015 08:10 AM   Modules accepted: Orders

## 2015-12-06 NOTE — Progress Notes (Signed)
Pt here today to for fasting labs from her physical exam which was on 11-27-15.

## 2016-08-27 DIAGNOSIS — J029 Acute pharyngitis, unspecified: Secondary | ICD-10-CM | POA: Diagnosis not present

## 2016-11-17 ENCOUNTER — Emergency Department (INDEPENDENT_AMBULATORY_CARE_PROVIDER_SITE_OTHER): Payer: BLUE CROSS/BLUE SHIELD

## 2016-11-17 ENCOUNTER — Encounter: Payer: Self-pay | Admitting: *Deleted

## 2016-11-17 ENCOUNTER — Emergency Department
Admission: EM | Admit: 2016-11-17 | Discharge: 2016-11-17 | Disposition: A | Discharge status: Home / Self Care | Payer: BLUE CROSS/BLUE SHIELD | Source: Home / Self Care | Attending: Family Medicine | Admitting: Family Medicine

## 2016-11-17 DIAGNOSIS — M7591 Shoulder lesion, unspecified, right shoulder: Secondary | ICD-10-CM | POA: Diagnosis not present

## 2016-11-17 DIAGNOSIS — S4991XA Unspecified injury of right shoulder and upper arm, initial encounter: Secondary | ICD-10-CM

## 2016-11-17 DIAGNOSIS — M25511 Pain in right shoulder: Secondary | ICD-10-CM

## 2016-11-17 MED ORDER — MELOXICAM 15 MG PO TABS: 15.0000 mg | ORAL_TABLET | Freq: Every day | ORAL | 0 refills | Status: DC

## 2016-11-17 NOTE — Discharge Instructions (Signed)
°  Meloxicam (Mobic) is an antiinflammatory to help with pain and inflammation.  Do not take ibuprofen, Advil, Aleve, or any other medications that contain NSAIDs while taking meloxicam as this may cause stomach upset or even ulcers if taken in large amounts for an extended period of time.  ° °

## 2016-11-17 NOTE — ED Triage Notes (Signed)
Pt c/o RT shoulder pain x 2 days; lifted some heavy objects. Taking Motrin with no relief.

## 2016-11-17 NOTE — ED Provider Notes (Signed)
CSN: [id number]     Arrival date & time 11/17/16  1646 History   First MD Initiated Contact with Patient 11/17/16 1710     Chief Complaint  Patient presents with  . Shoulder Pain   (Consider location/radiation/quality/duration/timing/severity/associated sxs/prior Treatment) HPI  Casey Fields is a 36 y.o. female presenting to UC with c/o Right shoulder pain for about 2 days. Pain started after she was doing yardwork including lifting heavy bags of soil.  Pain is worse while lying on Right side, pt normally sleeps on her Right side. Worse with certain movements.  She is Right hand dominant.  Pain is aching and sharp, severe at times but 4/10 at this time.  No prior injury or surgery to shoulder but she notes her shoulder would pop in and out of socket a few times on its own when she was younger. Mild intermittent numbness in Right hand.    Past Medical History:  Diagnosis Date  . Abnormal Pap smear age 56   +HPV; Repeats WNL  . Depression 2001   Hx of   . H/O LEEP (loop electrosurgical excision procedure) of cervix complicating pregnancy Age 68  . Seasonal allergies    Past Surgical History:  Procedure Laterality Date  . EYE SURGERY Bilateral 2003   retinal reattachment  . WISDOM TOOTH EXTRACTION  2000   Family History  Problem Relation Age of Onset  . Hypertension Father    Social History  Substance Use Topics  . Smoking status: Never Smoker  . Smokeless tobacco: Never Used  . Alcohol use 2.4 oz/week    4 Standard drinks or equivalent per week   OB History    Gravida Para Term Preterm AB Living   SAB TAB Ectopic Multiple Live Births           2     Review of Systems  Musculoskeletal: Positive for arthralgias and myalgias. Negative for back pain, neck pain and neck stiffness.  Skin: Negative for color change and wound.  Neurological: Positive for numbness. Negative for weakness.    Allergies  Penicillins  Home Medications   Prior to Admission  medications   Medication Sig Start Date End Date Taking? Authorizing Provider  phentermine 37.5 MG capsule Take 37.5 mg by mouth every morning.   Yes Historical Provider, MD  cetirizine (ZYRTEC) 5 MG tablet Take 5 mg by mouth daily.    Historical Provider, MD  ibuprofen (ADVIL,MOTRIN) 600 MG tablet Take 1 tablet (600 mg total) by mouth every 6 (six) hours as needed (pain scale < 4). 11/26/12   Napoleon Form, MD  meloxicam (MOBIC) 15 MG tablet Take 1 tablet (15 mg total) by mouth daily. For 7 days, then daily as needed for pain 11/17/16   Junius Finner, PA-C   Meds Ordered and Administered this Visit  Medications - No data to display  BP 105/73 (BP Location: Left Arm)   Pulse 96   Temp 98 F (36.7 C) (Oral)   Resp 16   Wt 183 lb (83 kg)   LMP 11/17/2016   SpO2 96%   BMI 30.45 kg/m  No data found.   Physical Exam  Constitutional: She is oriented to person, place, and time. She appears well-developed and well-nourished. No distress.  HENT:  Head: Normocephalic and atraumatic.  Eyes: EOM are normal.  Neck: Normal range of motion.  Cardiovascular: Normal rate.   Pulses:      Radial pulses  are 2+ on the right side.  Pulmonary/Chest: Effort normal.  Musculoskeletal: Normal range of motion. She exhibits tenderness. She exhibits no edema or deformity.  Right shoulder: no obvious deformity. Full ROM including full abduction and adduction. Tenderness to anterior and posterior aspect.  No midline spinal tenderness Full ROM elbow, non-tender.  5/5 grip strength.   Neurological: She is alert and oriented to person, place, and time.  Skin: Skin is warm and dry. She is not diaphoretic.  Psychiatric: She has a normal mood and affect. Her behavior is normal.  Nursing note and vitals reviewed.   Urgent Care Course     Procedures (including critical care time)  Labs Review Labs Reviewed - No data to display  Imaging Review Dg Shoulder Right  Result Date: 11/17/2016 CLINICAL DATA:   Right shoulder pain since yesterday after working in the yard. EXAM: RIGHT SHOULDER - 2+ VIEW COMPARISON:  None. FINDINGS: Minimal inferior glenohumeral spur formation. No fracture or dislocation. IMPRESSION: Minimal glenohumeral degenerative changes. Electronically Signed   By: Beckie Salts M.D.   On: 11/17/2016 17:35      MDM   1. Acute pain of right shoulder   2. Right shoulder injury, initial encounter    Pt c/o Right shoulder pain.  Plain films: Mild glenohumeral degenerative changes.  Rx: Mobic Sling for comfort Home care instructions provided. f/u with PCP or Sports Medicine in 1-2 weeks if not improving.     Junius Finner, PA-C 11/17/16 1858

## 2016-12-02 ENCOUNTER — Ambulatory Visit: Payer: BLUE CROSS/BLUE SHIELD | Admitting: Family Medicine

## 2017-02-13 ENCOUNTER — Ambulatory Visit (INDEPENDENT_AMBULATORY_CARE_PROVIDER_SITE_OTHER): Payer: BLUE CROSS/BLUE SHIELD | Admitting: Family Medicine

## 2017-02-13 ENCOUNTER — Encounter: Payer: Self-pay | Admitting: Family Medicine

## 2017-02-13 VITALS — BP 102/70 | HR 76 | Ht 65.0 in | Wt 167.0 lb

## 2017-02-13 DIAGNOSIS — Z01419 Encounter for gynecological examination (general) (routine) without abnormal findings: Secondary | ICD-10-CM | POA: Diagnosis not present

## 2017-02-13 DIAGNOSIS — Z23 Encounter for immunization: Secondary | ICD-10-CM | POA: Diagnosis not present

## 2017-02-13 NOTE — Patient Instructions (Signed)
Preventive Care 18-39 Years, Female Preventive care refers to lifestyle choices and visits with your health care provider that can promote health and wellness. What does preventive care include?  A yearly physical exam. This is also called an annual well check.  Dental exams once or twice a year.  Routine eye exams. Ask your health care provider how often you should have your eyes checked.  Personal lifestyle choices, including: ? Daily care of your teeth and gums. ? Regular physical activity. ? Eating a healthy diet. ? Avoiding tobacco and drug use. ? Limiting alcohol use. ? Practicing safe sex. ? Taking vitamin and mineral supplements as recommended by your health care provider. What happens during an annual well check? The services and screenings done by your health care provider during your annual well check will depend on your age, overall health, lifestyle risk factors, and family history of disease. Counseling Your health care provider may ask you questions about your:  Alcohol use.  Tobacco use.  Drug use.  Emotional well-being.  Home and relationship well-being.  Sexual activity.  Eating habits.  Work and work Statistician.  Method of birth control.  Menstrual cycle.  Pregnancy history.  Screening You may have the following tests or measurements:  Height, weight, and BMI.  Diabetes screening. This is done by checking your blood sugar (glucose) after you have not eaten for a while (fasting).  Blood pressure.  Lipid and cholesterol levels. These may be checked every 5 years starting at age 66.  Skin check.  Hepatitis C blood test.  Hepatitis B blood test.  Sexually transmitted disease (STD) testing.  BRCA-related cancer screening. This may be done if you have a family history of breast, ovarian, tubal, or peritoneal cancers.  Pelvic exam and Pap test. This may be done every 3 years starting at age 40. Starting at age 59, this may be done every 5  years if you have a Pap test in combination with an HPV test.  Discuss your test results, treatment options, and if necessary, the need for more tests with your health care provider. Vaccines Your health care provider may recommend certain vaccines, such as:  Influenza vaccine. This is recommended every year.  Tetanus, diphtheria, and acellular pertussis (Tdap, Td) vaccine. You may need a Td booster every 10 years.  Varicella vaccine. You may need this if you have not been vaccinated.  HPV vaccine. If you are 69 or younger, you may need three doses over 6 months.  Measles, mumps, and rubella (MMR) vaccine. You may need at least one dose of MMR. You may also need a second dose.  Pneumococcal 13-valent conjugate (PCV13) vaccine. You may need this if you have certain conditions and were not previously vaccinated.  Pneumococcal polysaccharide (PPSV23) vaccine. You may need one or two doses if you smoke cigarettes or if you have certain conditions.  Meningococcal vaccine. One dose is recommended if you are age 27-21 years and a first-year college student living in a residence hall, or if you have one of several medical conditions. You may also need additional booster doses.  Hepatitis A vaccine. You may need this if you have certain conditions or if you travel or work in places where you may be exposed to hepatitis A.  Hepatitis B vaccine. You may need this if you have certain conditions or if you travel or work in places where you may be exposed to hepatitis B.  Haemophilus influenzae type b (Hib) vaccine. You may need this if  you have certain risk factors.  Talk to your health care provider about which screenings and vaccines you need and how often you need them. This information is not intended to replace advice given to you by your health care provider. Make sure you discuss any questions you have with your health care provider. Document Released: 09/02/2001 Document Revised: 03/26/2016  Document Reviewed: 05/08/2015 Elsevier Interactive Patient Education  2017 [employer].

## 2017-02-13 NOTE — Progress Notes (Signed)
   Subjective:     Casey Fields is a 36 y.o. female and is here for a comprehensive physical exam. The patient reports no problems. Reports regular cycles.  Social History   Social History  . Marital status: Married    Spouse name: N/A  . Number of children: N/A  . Years of education: N/A   Occupational History  . Not on file.   Social History Main Topics  . Smoking status: Never Smoker  . Smokeless tobacco: Never Used  . Alcohol use 2.4 oz/week    4 Standard drinks or equivalent per week  . Drug use: No  . Sexual activity: Yes    Partners: Male    Birth control/ protection: Surgical   Other Topics Concern  . Not on file   Social History Narrative  . No narrative on file   Health Maintenance  Topic Date Due  . TETANUS/TDAP  06/07/1999  . INFLUENZA VACCINE  02/18/2017  . PAP SMEAR  11/27/2018  . HIV Screening  Completed    The following portions of the patient's history were reviewed and updated as appropriate: allergies, current medications, past family history, past medical history, past social history, past surgical history and problem list.  Review of Systems Pertinent items noted in HPI and remainder of comprehensive ROS otherwise negative.   Objective:    BP 102/70   Pulse 76   Ht 5\' 5"  (1.651 m)   Wt 167 lb (75.8 kg)   LMP 02/07/2017   BMI 27.79 kg/m  General appearance: alert, cooperative and appears stated age Head: Normocephalic, without obvious abnormality, atraumatic Neck: no adenopathy, supple, symmetrical, trachea midline and thyroid not enlarged, symmetric, no tenderness/mass/nodules Lungs: clear to auscultation bilaterally Breasts: normal appearance, no masses or tenderness Heart: regular rate and rhythm, S1, S2 normal, no murmur, click, rub or gallop Abdomen: soft, non-tender; bowel sounds normal; no masses,  no organomegaly Pelvic: cervix normal in appearance, external genitalia normal, no adnexal masses or tenderness, no cervical motion  tenderness, uterus normal size, shape, and consistency and vagina normal without discharge Extremities: extremities normal, atraumatic, no cyanosis or edema Pulses: 2+ and symmetric Skin: Skin color, texture, turgor normal. No rashes or lesions Lymph nodes: Cervical, supraclavicular, and axillary nodes normal. Neurologic: Grossly normal    Assessment:    Healthy female exam.      Plan:    . Problem List Items Addressed This Visit    None    Visit Diagnoses    Encounter for gynecological examination without abnormal finding    -  Primary     Has had recent labs Normal pap in 2017. TDaP updated today See After Visit Summary for Counseling Recommendations

## 2017-02-13 NOTE — Progress Notes (Signed)
Last pap 11/2015 - Normal

## 2017-10-05 ENCOUNTER — Ambulatory Visit (INDEPENDENT_AMBULATORY_CARE_PROVIDER_SITE_OTHER): Payer: BLUE CROSS/BLUE SHIELD | Admitting: Family Medicine

## 2017-10-05 ENCOUNTER — Encounter: Payer: Self-pay | Admitting: Family Medicine

## 2017-10-05 ENCOUNTER — Ambulatory Visit (INDEPENDENT_AMBULATORY_CARE_PROVIDER_SITE_OTHER): Payer: BLUE CROSS/BLUE SHIELD

## 2017-10-05 VITALS — BP 121/84 | HR 68 | Ht 65.0 in | Wt 171.0 lb

## 2017-10-05 DIAGNOSIS — M25512 Pain in left shoulder: Secondary | ICD-10-CM

## 2017-10-05 DIAGNOSIS — S4992XA Unspecified injury of left shoulder and upper arm, initial encounter: Secondary | ICD-10-CM | POA: Diagnosis not present

## 2017-10-05 MED ORDER — DICLOFENAC SODIUM 1 % TD GEL: 2.0000 g | Freq: Four times a day (QID) | TRANSDERMAL | 11 refills | Status: DC

## 2017-10-05 NOTE — Progress Notes (Signed)
Casey Fields is a 37 y.o. female who [facility] to [facility] today for left shoulder pain.  States he was in her normal state of health 3 months ago.  She was pushing up from a chair when her child jumped on and injured her left shoulder.  She notes persistent pain located at the anterior aspect of her shoulder and across the lateral upper arm.  The pain is worse with overhead motion and reaching back.  The pain is also quite bothersome at bedtime.  She denies any radiating pain weakness or numbness.  She is tried some over-the-counter medications which have not helped.  Additionally she is tried some home exercise program which have not helped.  The pain is interfering with her quality of life and her ability to sleep normally and exercise normally.   Past Medical History:  Diagnosis Date  . Abnormal Pap smear age 37   +HPV; Repeats WNL  . Depression 2001   Hx of   . H/O LEEP (loop electrosurgical excision procedure) of cervix complicating pregnancy Age 37  . Seasonal allergies    Past Surgical History:  Procedure Laterality Date  . EYE SURGERY Bilateral 2003   retinal reattachment  . WISDOM TOOTH EXTRACTION  2000   Social History   Tobacco Use  . Smoking status: Never Smoker  . Smokeless tobacco: Never Used  Substance Use Topics  . Alcohol use: Yes    Alcohol/week: 2.4 oz    Types: 4 Standard drinks or equivalent per week   family history includes Hypertension in her father.  ROS:  No headache, visual changes, nausea, vomiting, diarrhea, constipation, dizziness, abdominal pain, skin rash, fevers, chills, night sweats, weight loss, swollen lymph nodes, body aches, joint swelling, muscle aches, chest pain, shortness of breath, mood changes, visual or auditory hallucinations.    Medications: Current Outpatient Medications  Medication Sig Dispense Refill  . cetirizine (ZYRTEC) 5 MG tablet Take 5 mg by mouth daily.    Marland Kitchen. ibuprofen  (ADVIL,MOTRIN) 600 MG tablet Take 1 tablet (600 mg total) by mouth every 6 (six) hours as needed (pain scale < 4). 30 tablet 1  . diclofenac sodium (VOLTAREN) 1 % GEL Apply 2 g topically 4 (four) times daily. To affected joint. 100 g 11   No current facility-administered medications for this visit.    Allergies  Allergen Reactions  . Penicillins Hives     Exam:  BP 121/84   Pulse 68   Ht 5\' 5"  (1.651 m)   Wt 171 lb (77.6 kg)   LMP 09/15/2017   BMI 28.46 kg/m  General: Well Developed, well nourished, and in no acute distress.  Neuro/Psych: Alert and oriented x3, extra-ocular muscles intact, able to move all 4 extremities, sensation grossly intact. Skin: Warm and dry, no rashes noted.  Respiratory: Not using accessory muscles, speaking in full sentences, trachea midline.  Cardiovascular: Pulses palpable, no extremity edema. Abdomen: Does not appear distended. MSK:  C-spine: Nontender to midline normal neck motion. Left shoulder: Normal appearing.  Mildly tender palpation at the bicipital groove Range of motion intact pain with abduction and internal rotation Strength 5/5 internal rotation, 4/5 external and abduction limited by pain.  Positive Hawkins and Neer's test. Positive O'Brien's test. Positive empty can test. Negative Yergason's and speeds test. Negative clunk and relocation test.   No results found for this or any previous visit (from the past 48 hour(s)). Dg Shoulder Left  Result Date: 10/05/2017 CLINICAL DATA:  Left shoulder pain since an injury getting out of a chair. Initial encounter. EXAM: LEFT SHOULDER - 2+ VIEW COMPARISON:  None. FINDINGS: There is no evidence of fracture or dislocation. There is no evidence of arthropathy or other focal bone abnormality. Soft tissues are unremarkable. IMPRESSION: Normal exam. Electronically Signed   By: Drusilla Kanner M.D.   On: 10/05/2017 16:05  I personally (independently) visualized and performed the interpretation of the  images attached in this note.     Assessment and Plan: 37 y.o. female with left shoulder pain.  Symptoms present now for 3 months.  Concerning for rotator cuff tear versus tendinitis versus impingement.  Patient is failing conservative management.  Plan for MRI to guide treatment possibly surgery.  In the meantime proceed with home exercise program.  Recheck following MRI.    Orders Placed This Encounter  Procedures  . DG Shoulder Left    Standing Status:   Future    Number of Occurrences:   1    Standing Expiration Date:   12/06/2018    Order Specific Question:   Reason for Exam (SYMPTOM  OR DIAGNOSIS REQUIRED)    Answer:   shoulder injury    Order Specific Question:   Is patient pregnant?    Answer:   No    Order Specific Question:   Preferred imaging location?    Answer:   [facility]    Order Specific Question:   Radiology Contrast Protocol - do NOT remove file path    Answer:   [email or web address]  . MR Shoulder Left Wo Contrast    Standing Status:   Future    Standing Expiration Date:   12/06/2018    Order Specific Question:   What is the patient's sedation requirement?    Answer:   No Sedation    Order Specific Question:   Does the patient have a pacemaker or implanted devices?    Answer:   No    Order Specific Question:   Preferred imaging location?    Answer:   [facility] (table limit-350lbs)    Order Specific Question:   Radiology Contrast Protocol - do NOT remove file path    Answer:   \\[email or web address]   Meds ordered this encounter  Medications  . diclofenac sodium (VOLTAREN) 1 % GEL    Sig: Apply 2 g topically 4 (four) times daily. To affected joint.    Dispense:  100 g    Refill:  11    Discussed warning signs or symptoms. Please see discharge instructions. Patient expresses understanding.

## 2017-10-05 NOTE — Patient Instructions (Addendum)
Thank you for coming in today. You should hear about shoulder MRI.  If you dont hear anything soon let me know.  Recheck a few days after MRI to go over results and plan.   Use diclofenac gel as needed up to 4x daily.  You can take ibuprofen or aleve or tylenol at bedtime.     Shoulder Impingement Syndrome Rehab Ask your health care provider which exercises are safe for you. Do exercises exactly as told by your health care provider and adjust them as directed. It is normal to feel mild stretching, pulling, tightness, or discomfort as you do these exercises, but you should stop right away if you feel sudden pain or your pain gets worse.Do not begin these exercises until told by your health care provider. Stretching and range of motion exercise This exercise warms up your muscles and joints and improves the movement and flexibility of your shoulder. This exercise also helps to relieve pain and stiffness. Exercise A: Passive horizontal adduction  1. Sit or stand and pull your left / right elbow across your chest, toward your other shoulder. Stop when you feel a gentle stretch in the back of your shoulder and upper arm. ? Keep your arm at shoulder height. ? Keep your arm as close to your body as you comfortably can. 2. Hold for __________ seconds. 3. Slowly return to the starting position. Repeat __________ times. Complete this exercise __________ times a day. Strengthening exercises These exercises build strength and endurance in your shoulder. Endurance is the ability to use your muscles for a long time, even after they get tired. Exercise B: External rotation, isometric 1. Stand or sit in a doorway, facing the door frame. 2. Bend your left / right elbow and place the back of your wrist against the door frame. Only your wrist should be touching the frame. Keep your upper arm at your side. 3. Gently press your wrist against the door frame, as if you are trying to push your arm away from your  abdomen. ? Avoid shrugging your shoulder while you press your hand against the door frame. Keep your shoulder blade tucked down toward the middle of your back. 4. Hold for __________ seconds. 5. Slowly release the tension, and relax your muscles completely before you do the exercise again. Repeat __________ times. Complete this exercise __________ times a day. Exercise C: Internal rotation, isometric  1. Stand or sit in a doorway, facing the door frame. 2. Bend your left / right elbow and place the inside of your wrist against the door frame. Only your wrist should be touching the frame. Keep your upper arm at your side. 3. Gently press your wrist against the door frame, as if you are trying to push your arm toward your abdomen. ? Avoid shrugging your shoulder while you press your hand against the door frame. Keep your shoulder blade tucked down toward the middle of your back. 4. Hold for __________ seconds. 5. Slowly release the tension, and relax your muscles completely before you do the exercise again. Repeat __________ times. Complete this exercise __________ times a day. Exercise D: Scapular protraction, supine  1. Lie on your back on a firm surface. Hold a __________ weight in your left / right hand. 2. Raise your left / right arm straight into the air so your hand is directly above your shoulder joint. 3. Push the weight into the air so your shoulder lifts off of the surface that you are lying on. Do not  move your head, neck, or back. 4. Hold for __________ seconds. 5. Slowly return to the starting position. Let your muscles relax completely before you repeat this exercise. Repeat __________ times. Complete this exercise __________ times a day. Exercise E: Scapular retraction  1. Sit in a stable chair without armrests, or stand. 2. Secure an exercise band to a stable object in front of you so the band is at shoulder height. 3. Hold one end of the exercise band in each hand. Your  palms should face down. 4. Squeeze your shoulder blades together and move your elbows slightly behind you. Do not shrug your shoulders while you do this. 5. Hold for __________ seconds. 6. Slowly return to the starting position. Repeat __________ times. Complete this exercise __________ times a day. Exercise F: Shoulder extension  1. Sit in a stable chair without armrests, or stand. 2. Secure an exercise band to a stable object in front of you where the band is above shoulder height. 3. Hold one end of the exercise band in each hand. 4. Straighten your elbows and lift your hands up to shoulder height. 5. Squeeze your shoulder blades together and pull your hands down to the sides of your thighs. Stop when your hands are straight down by your sides. Do not let your hands go behind your body. 6. Hold for __________ seconds. 7. Slowly return to the starting position. Repeat __________ times. Complete this exercise __________ times a day. This information is not intended to replace advice given to you by your health care provider. Make sure you discuss any questions you have with your health care provider. Document Released: 07/07/2005 Document Revised: 03/13/2016 Document Reviewed: 06/09/2015 Elsevier Interactive Patient Education  [employer].

## 2017-10-12 ENCOUNTER — Ambulatory Visit (INDEPENDENT_AMBULATORY_CARE_PROVIDER_SITE_OTHER): Payer: BLUE CROSS/BLUE SHIELD

## 2017-10-12 DIAGNOSIS — M67814 Other specified disorders of tendon, left shoulder: Secondary | ICD-10-CM | POA: Diagnosis not present

## 2017-10-12 DIAGNOSIS — M7582 Other shoulder lesions, left shoulder: Secondary | ICD-10-CM | POA: Diagnosis not present

## 2017-10-12 DIAGNOSIS — M25512 Pain in left shoulder: Secondary | ICD-10-CM

## 2017-10-14 ENCOUNTER — Encounter: Payer: Self-pay | Admitting: Family Medicine

## 2017-10-14 ENCOUNTER — Ambulatory Visit: Payer: BLUE CROSS/BLUE SHIELD | Admitting: Family Medicine

## 2017-10-14 DIAGNOSIS — M7582 Other shoulder lesions, left shoulder: Secondary | ICD-10-CM | POA: Insufficient documentation

## 2017-10-14 NOTE — Patient Instructions (Signed)
Thank you for coming in today. Attend PT.  Continue home exercises.  The the diclofenac gel.  Recheck in 6 weeks if not getting better.  If you get better you do not need to come back.   Rotator Cuff Tendinitis Rotator cuff tendinitis is inflammation of the tough, cord-like bands that connect muscle to bone (tendons) in the rotator cuff. The rotator cuff includes all of the muscles and tendons that connect the arm to the shoulder. The rotator cuff holds the head of the upper arm bone (humerus) in the cup (fossa) of the shoulder blade (scapula). This condition can lead to a long-lasting (chronic) tear. The tear may be partial or complete. What are the causes? This condition is usually caused by overusing the rotator cuff. What increases the risk? This condition is more likely to develop in athletes and workers who frequently use their shoulder or reach over their heads. This can include activities such as:  Tennis.  Baseball or softball.  Swimming.  Construction work.  Painting.  What are the signs or symptoms? Symptoms of this condition include:  Pain spreading (radiating) from the shoulder to the upper arm.  Swelling and tenderness in front of the shoulder.  Pain when reaching, pulling, or lifting the arm above the head.  Pain when lowering the arm from above the head.  Minor pain in the shoulder when resting.  Increased pain in the shoulder at night.  Difficulty placing the arm behind the back.  How is this diagnosed? This condition is diagnosed with a medical history and physical exam. Tests may also be done, including:  X-rays.  MRI.  Ultrasounds.  CT or MR arthrogram. During this test, a contrast material is injected and then images are taken.  How is this treated? Treatment for this condition depends on the severity of the condition. In less severe cases, treatment may include:  Rest. This may be done with a sling that holds the shoulder still  (immobilization). Your health care provider may also recommend avoiding activities that involve lifting your arm over your head.  Icing the shoulder.  Anti-inflammatory medicines, such as aspirin or ibuprofen.  In more severe cases, treatment may include:  Physical therapy.  Steroid injections.  Surgery.  Follow these instructions at home: If you have a sling:  Wear the sling as told by your health care provider. Remove it only as told by your health care provider.  Loosen the sling if your fingers tingle, become numb, or turn cold and blue.  Keep the sling clean.  If the sling is not waterproof, do not let it get wet. Remove it, if allowed, or cover it with a watertight covering when you take a bath or shower. Managing pain, stiffness, and swelling  If directed, put ice on the injured area. ? If you have a removable sling, remove it as told by your health care provider. ? Put ice in a plastic bag. ? Place a towel between your skin and the bag. ? Leave the ice on for 20 minutes, 2-3 times a day.  Move your fingers often to avoid stiffness and to lessen swelling.  Raise (elevate) the injured area above the level of your heart while you are lying down.  Find a comfortable sleeping position or sleep on a recliner, if available. Driving  Do not drive or use heavy machinery while taking prescription pain medicine.  Ask your health care provider when it is safe to drive if you have a sling on your  arm. Activity  Rest your shoulder as told by your health care provider.  Return to your normal activities as told by your health care provider. Ask your health care provider what activities are safe for you.  Do any exercises or stretches as told by your health care provider.  If you do repetitive overhead tasks, take small breaks in between and include stretching exercises as told by your health care provider. General instructions  Do not use any products that contain  nicotine or tobacco, such as cigarettes and e-cigarettes. These can delay healing. If you need help quitting, ask your health care provider.  Take over-the-counter and prescription medicines only as told by your health care provider.  Keep all follow-up visits as told by your health care provider. This is important. Contact a health care provider if:  Your pain gets worse.  You have new pain in your arm, hands, or fingers.  Your pain is not relieved with medicine or does not get better after 6 weeks of treatment.  You have cracking sensations when moving your shoulder in certain directions.  You hear a snapping sound after using your shoulder, followed by severe pain and weakness. Get help right away if:  Your arm, hand, or fingers are numb or tingling.  Your arm, hand, or fingers are swollen or painful or they turn white or blue. Summary  Rotator cuff tendinitis is inflammation of the tough, cord-like bands that connect muscle to bone (tendons) in the rotator cuff.  This condition is usually caused by overusing the rotator cuff, which includes all of the muscles and tendons that connect the arm to the shoulder.  This condition is more likely to develop in athletes and workers who frequently use their shoulder or reach over their heads.  Treatment generally includes rest, anti-inflammatory medicines, and icing. In some cases, physical therapy and steroid injections may be needed. In severe cases, surgery may be needed. This information is not intended to replace advice given to you by your health care provider. Make sure you discuss any questions you have with your health care provider. Document Released: 09/27/2003 Document Revised: 06/23/2016 Document Reviewed: 06/23/2016 Elsevier Interactive Patient Education  2017 [employer].

## 2017-10-14 NOTE — Progress Notes (Signed)
Casey Fields is a 37 y.o. female who [facility] to [facility] today for left shoulder pain.  States he was seen on March 18 for left shoulder pain following an injury 3 months prior.  She had been failing conservative management and an MRI was obtained for fear of a rotator cuff tear.  She is here today for follow-up.  Fortunately she notes that with diclofenac gel and home exercise program she has had been experiencing improving symptoms.  She notes continued pain at the anterior aspect of the shoulder and along the posterior aspect of the shoulder.  She continues has pain with overhead motion and reaching back.  MRI showed only mild infraspinatus tendinitis   Past Medical History:  Diagnosis Date  . Abnormal Pap smear age 37   +HPV; Repeats WNL  . Depression 2001   Hx of   . H/O LEEP (loop electrosurgical excision procedure) of cervix complicating pregnancy Age 89  . Seasonal allergies    Past Surgical History:  Procedure Laterality Date  . EYE SURGERY Bilateral 2003   retinal reattachment  . WISDOM TOOTH EXTRACTION  2000   Social History   Tobacco Use  . Smoking status: Never Smoker  . Smokeless tobacco: Never Used  Substance Use Topics  . Alcohol use: Yes    Alcohol/week: 2.4 oz    Types: 4 Standard drinks or equivalent per week     ROS:  As above   Medications: Current Outpatient Medications  Medication Sig Dispense Refill  . cetirizine (ZYRTEC) 5 MG tablet Take 5 mg by mouth daily.    . diclofenac sodium (VOLTAREN) 1 % GEL Apply 2 g topically 4 (four) times daily. To affected joint. 100 g 11  . ibuprofen (ADVIL,MOTRIN) 600 MG tablet Take 1 tablet (600 mg total) by mouth every 6 (six) hours as needed (pain scale < 4). 30 tablet 1   No current facility-administered medications for this visit.    Allergies  Allergen Reactions  . Penicillins Hives     Exam:  BP 120/76   Pulse 68   Wt 174 lb (78.9 kg)   LMP  10/12/2017   BMI 28.96 kg/m  General: Well Developed, well nourished, and in no acute distress.  Neuro/Psych: Alert and oriented x3, extra-ocular muscles intact, able to move all 4 extremities, sensation grossly intact. Skin: Warm and dry, no rashes noted.  Respiratory: Not using accessory muscles, speaking in full sentences, trachea midline.  Cardiovascular: Pulses palpable, no extremity edema. Abdomen: Does not appear distended. MSK:  Left shoulder normal-appearing.  Mildly tender to palpation bicipital groove. Nontender otherwise. Normal motion. Negative Yergason's and speeds test.    No results found for this or any previous visit (from the past 48 hour(s)). Mr Shoulder Left Wo Contrast  Result Date: 10/12/2017 CLINICAL DATA:  Pushing injury 2 months ago. Decreased motion. Pain on movement which is not improving. EXAM: MRI OF THE LEFT SHOULDER WITHOUT CONTRAST TECHNIQUE: Multiplanar, multisequence MR imaging of the shoulder was performed. No intravenous contrast was administered. COMPARISON:  None. FINDINGS: Rotator cuff: Supraspinatus tendon is intact. Mild tendinosis of the infraspinatus tendon without a tear. Teres minor tendon is intact. Subscapularis tendon is intact. Muscles: No atrophy or fatty replacement of nor abnormal signal within, the muscles of the rotator cuff. Biceps long head:  Intact. Acromioclavicular Joint: Normal acromioclavicular joint. Type I acromion. Trace subacromial/subdeltoid bursal fluid. Glenohumeral Joint: No joint effusion. No chondral defect. Labrum: Grossly intact, but evaluation is  limited by lack of intraarticular fluid. Bones:  No marrow abnormality, fracture or dislocation. Other: No fluid collection or hematoma. IMPRESSION: 1. Mild tendinosis of the infraspinatus tendon without a tear. Electronically Signed   By: Elige Ko   On: 10/12/2017 13:22   My personal interpretation of the MRI shows fluid surrounding the bicipital tendon and the tendon  sheath at the bicipital groove with no obvious biceps tendon tear.  Additionally infraspinatus tendinitis present.   Assessment and Plan: 37 y.o. female with left shoulder pain with hyperechoic fluid infraspinatus tendinitis seen on MRI.  Additionally patient is tender in the bicipital groove has fluid collection around the biceps tendon sheath seen on MRI.  Her biceps tendinitis testing is negative today.  Plan for formal physical therapy and continue diclofenac gel.  If not better next step would be injection.    Orders Placed This Encounter  Procedures  . Ambulatory referral to Physical Therapy    Referral Priority:   Routine    Referral Type:   Physical Medicine    Referral Reason:   Specialty Services Required    Requested Specialty:   Physical Therapy   No orders of the defined types were placed in this encounter.   Discussed warning signs or symptoms. Please see discharge instructions. Patient expresses understanding.  I spent 15 minutes with this patient, greater than 50% was face-to-face time counseling regarding ddx and treatment plan.

## 2017-11-02 ENCOUNTER — Ambulatory Visit: Payer: BLUE CROSS/BLUE SHIELD | Admitting: Physical Therapy

## 2017-11-02 ENCOUNTER — Encounter: Payer: Self-pay | Admitting: Physical Therapy

## 2017-11-02 DIAGNOSIS — M25612 Stiffness of left shoulder, not elsewhere classified: Secondary | ICD-10-CM

## 2017-11-02 DIAGNOSIS — M25512 Pain in left shoulder: Secondary | ICD-10-CM | POA: Diagnosis not present

## 2017-11-02 DIAGNOSIS — M6281 Muscle weakness (generalized): Secondary | ICD-10-CM

## 2017-11-02 NOTE — Patient Instructions (Signed)
   IONTOPHORESIS PATIENT PRECAUTIONS & CONTRAINDICATIONS:  . Redness under one or both electrodes can occur.  This characterized by a uniform redness that usually disappears within 12 hours of treatment. . Small pinhead size blisters may result in response to the drug.  Contact your physician if the problem persists more than 24 hours. . On rare occasions, iontophoresis therapy can result in temporary skin reactions such as rash, inflammation, irritation or burns.  The skin reactions may be the result of individual sensitivity to the ionic solution used, the condition of the skin at the start of treatment, reaction to the materials in the electrodes, allergies or sensitivity to dexamethasone, or a poor connection between the patch and your skin.  Discontinue using iontophoresis if you have any of these reactions and report to your therapist. . Remove the Patch or electrodes if you have any undue sensation of pain or burning during the treatment and report discomfort to your therapist. . Tell your Therapist if you have had known adverse reactions to the application of electrical current. . Wear the patch for 8 hrs . The Patch can be worn during normal activity, however excessive motion where the electrodes have been placed can cause poor contact between the skin and the electrode or uneven electrical current resulting in greater risk of skin irritation. Marland Kitchen. Keep out of the reach of children.   . DO NOT use if you have a cardiac pacemaker or any other electrically sensitive implanted device. . DO NOT use if you have a known sensitivity to dexamethasone. . DO NOT use during Magnetic Resonance Imaging (MRI). . DO NOT use over broken or compromised skin (e.g. sunburn, cuts, or acne) due to the increased risk of skin reaction. . DO NOT SHAVE over the area to be treated:  To establish good contact between the Patch and the skin, excessive hair may be clipped. . DO NOT place the Patch or electrodes on or  over your eyes, directly over your heart, or brain. . DO NOT reuse the Patch or electrodes as this may cause burns to occur.

## 2017-11-02 NOTE — Therapy (Signed)
[facility] [address] [place], [place], [address] Phone: [phone]   Fax:  [phone]  Physical Therapy Evaluation  Patient Details  Name: Sharon SellerStacey H Corsi MRN: [id number] Date of Birth: [birth year] Referring Provider: Dr Clementeen GrahamEvan Corey   Encounter Date: 11/02/2017  PT End of Session - 11/02/17 0801    Visit Number  1    Number of Visits  8    Date for PT Re-Evaluation  11/30/17    PT Start Time  0801    PT Stop Time  0838    PT Time Calculation (min)  37 min       Past Medical History:  Diagnosis Date  . Abnormal Pap smear age 37   +HPV; Repeats WNL  . Depression 2001   Hx of   . H/O LEEP (loop electrosurgical excision procedure) of cervix complicating pregnancy Age 37  . Seasonal allergies     Past Surgical History:  Procedure Laterality Date  . EYE SURGERY Bilateral 2003   retinal reattachment  . WISDOM TOOTH EXTRACTION  2000    There were no vitals filed for this visit.   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  She has had intermittent pain that has gotten progressively worse.  She now had pain with holding her arm up, unable to dry her hair has to modifiy her dressing.      Diagnostic tests  MRI slight tendonitis    Patient Stated Goals  get out of pain and dry her hair by herself.  Return to regular exercise.     Currently in Pain?  No/denies with position and activity.         [facility] PT Assessment - 11/02/17 0001      Assessment   Medical Diagnosis  Lt RTC tendonitis    Referring Provider  Dr Clementeen GrahamEvan Corey    Onset Date/Surgical Date  08/04/17    Hand Dominance  Right    Next MD Visit  after PT    Prior Therapy  never       Precautions   Precautions  None      Balance Screen   Has the patient fallen in the past 6 months  No      Prior Function   Vocation  Full time employment    Vocation Requirements  HR manager    Leisure  work out, play with children 7,4      Observation/Other Assessments   Focus on Therapeutic Outcomes (FOTO)   41% limited      Posture/Postural Control   Posture/Postural Control  Postural limitations    Postural Limitations  Decreased thoracic kyphosis;Forward head;Rounded Shoulders      ROM / Strength   AROM / PROM / Strength  AROM;Strength      AROM   Overall AROM Comments  pain Lt shoulder flex/behind head and reaching behind    AROM Assessment Site  Cervical;Shoulder    Right/Left Shoulder  -- WNL except Lt shoulder abduct 152    Cervical Flexion  WNL    Cervical Extension  WNL    Cervical - Right Side Bend  WNL    Cervical - Left Side Bend  WNL    Cervical - Right Rotation  WNL    Cervical - Left Rotation  WNL  Strength   Strength Assessment Site  Shoulder;Elbow    Right/Left Shoulder  Left WNL Rt     Left Shoulder Flexion  4-/5 pain    Left Shoulder ABduction  4+/5    Left Shoulder External Rotation  4+/5 with pain    Left Shoulder Horizontal ABduction  5/5 with pain       Palpation   Palpation comment  tenderness posterior and anterior Lt shoulder, some tightness posteriorly.       Special Tests    Special Tests  Rotator Cuff Impingement    Rotator Cuff Impingment tests  Leanord Asal test      Hawkins-Kennedy test   Findings  Positive    Side  Left                Objective measurements completed on examination: See above findings.      [facility] Adult PT Treatment/Exercise - 11/02/17 0001      Exercises   Exercises  Shoulder      Shoulder Exercises: Standing   External Rotation  Strengthening;Left;10 reps;Theraband 3 sets    Theraband Level (Shoulder External Rotation)  Level 1 (Yellow) red had pain    External Rotation Weight (lbs)  -- towel under arm    Internal Rotation  Strengthening;Left;10 reps;Theraband 3 sets, towel under arm    Theraband Level  (Shoulder Internal Rotation)  Level 1 (Yellow)      Shoulder Exercises: Stretch   Other Shoulder Stretches  attempted multiple different doorway stretches - all caused pain so not doing this    Other Shoulder Stretches  standing scap retraction with holds.       Modalities   Modalities  Iontophoresis      Iontophoresis   Type of Iontophoresis  Dexamethasone    Location  posterior Lt shoulder    Dose  1.0cc    Time  patch             PT Education - 11/02/17 0838    Education provided  Yes    Education Details  HEP DN    Person(s) Educated  Patient    Methods  Explanation;Demonstration;Handout    Comprehension  Returned demonstration;Verbalized understanding          PT Long Term Goals - 11/02/17 0844      PT LONG TERM GOAL #1   Title  I with advanced HEP for shoulder and upper back ( 11/30/17)     Time  4    Period  Weeks    Status  New      PT LONG TERM GOAL #2   Title  report no pain in Lt shoulder with running ( 11/30/17)     Time  4    Period  Weeks    Status  New      PT LONG TERM GOAL #3   Title  demo painfree full Lt shoulder ROM to allow her to dry her hair without pain ( 11/30/17)     Time  4    Period  Weeks      PT LONG TERM GOAL #4   Title  improve Lt shoulder strength =/> 5-/5 without pain ( 11/30/17)     Time  4    Period  Weeks    Status  New      PT LONG TERM GOAL #5   Title  improve FOTO =/< 27% limited ( 11/30/17)     Time  4    Period  Weeks    Status  New             Plan - 11/02/17 4540    Clinical Impression Statement  37 yo female with ~ 3 month h/o Lt shoulder pain after having her daughter come down on her shoulder hard.  She has point tenderness posterior and anterior Lt shoulder, (+) impingement tests and some weakness.  She is now limited in her ADLs and is moving into protective posturing.     Clinical Presentation  Stable    Clinical Decision Making  Low    Rehab Potential  Excellent    PT Frequency  2x /  week    PT Duration  4 weeks pt requested 2 week trial    PT Treatment/Interventions  Iontophoresis 4mg /ml Dexamethasone;Dry needling;Manual techniques;Moist Heat;Taping;Patient/family education;Ultrasound;Cryotherapy;Electrical Stimulation;Passive range of motion    PT Next Visit Plan  RTC strengthening, scapular strenghtening, cont ionto    Consulted and Agree with Plan of Care  Patient       Patient will benefit from skilled therapeutic intervention in order to improve the following deficits and impairments:  Pain, Impaired UE functional use, Decreased strength, Decreased range of motion  Visit Diagnosis: Acute pain of left shoulder - Plan: PT plan of care cert/re-cert  Muscle weakness (generalized) - Plan: PT plan of care cert/re-cert  Stiffness of left shoulder, not elsewhere classified - Plan: PT plan of care cert/re-cert     Problem List Patient Active Problem List   Diagnosis Date Noted  . Rotator cuff tendonitis, left 10/14/2017  . Biological false positive RPR test 09/28/2012    Roderic Scarce PT  11/02/2017, 8:48 AM  [facility] [address] [place], [place], [address] Phone: [phone]   Fax:  [phone]  Name: DAVEIGH BATTY MRN: [id number] Date of Birth: 10-05-1980

## 2017-11-05 ENCOUNTER — Encounter: Payer: Self-pay | Admitting: Physical Therapy

## 2017-11-05 ENCOUNTER — Ambulatory Visit: Payer: BLUE CROSS/BLUE SHIELD | Admitting: Physical Therapy

## 2017-11-05 DIAGNOSIS — M25512 Pain in left shoulder: Secondary | ICD-10-CM

## 2017-11-05 DIAGNOSIS — M25612 Stiffness of left shoulder, not elsewhere classified: Secondary | ICD-10-CM | POA: Diagnosis not present

## 2017-11-05 DIAGNOSIS — M6281 Muscle weakness (generalized): Secondary | ICD-10-CM

## 2017-11-05 NOTE — Therapy (Signed)
[facility] [address] [place], [place], [address] Phone: [phone]   Fax:  [phone]  Physical Therapy Treatment  Patient Details  Name: Casey Fields MRN: [id number] Date of Birth: 07-24-1980 Referring Provider: Dr. Clementeen GrahamEvan Corey   Encounter Date: 11/05/2017  PT End of Session - 11/05/17 1703    Visit Number  2    Number of Visits  8    Date for PT Re-Evaluation  11/30/17    PT Start Time  1703    PT Stop Time  1749    PT Time Calculation (min)  46 min       Past Medical History:  Diagnosis Date  . Abnormal Pap smear age 37   +HPV; Repeats WNL  . Depression 2001   Hx of   . H/O LEEP (loop electrosurgical excision procedure) of cervix complicating pregnancy Age 37  . Seasonal allergies     Past Surgical History:  Procedure Laterality Date  . EYE SURGERY Bilateral 2003   retinal reattachment  . WISDOM TOOTH EXTRACTION  2000    There were no vitals filed for this visit.  Subjective Assessment - 11/05/17 1707    Subjective  Pt reports she tried to take her coat off the day after last session and it was very painful.  She has no pain at rest, but pain in Lt ant/post shoulder is up to 8/10 with certain motions.  no skin irritation from ionto, but also no noticable difference.     Patient Stated Goals  get out of pain and dry her hair by herself.  Return to regular exercise.     Currently in Pain?  No/denies         [facility] PT Assessment - 11/05/17 0001      Assessment   Medical Diagnosis  Lt RTC tendonitis    Referring Provider  Dr. Clementeen GrahamEvan Corey    Onset Date/Surgical Date  08/04/17    Hand Dominance  Right    Next MD Visit  after PT        [facility] Adult PT Treatment/Exercise - 11/05/17 0001      Self-Care   Self-Care  Other Self-Care Comments    Other Self-Care Comments   Pt educated in self massage with ball to pec and posterior shoulder girdle; pt verbalized understanding and returned demo.        Shoulder Exercises: Sidelying   Other Sidelying Exercises  empty can small range x 10, 2 sets      Shoulder Exercises: Standing   External Rotation  Strengthening;Left;10 reps;Theraband 1 set each color    Theraband Level (Shoulder External Rotation)  Level 1 (Yellow);Level 2 (Red) 3rd set bilat with yellow     Internal Rotation  Strengthening;Left;10 reps;Theraband towel under arm    Theraband Level (Shoulder Internal Rotation)  Level 1 (Yellow);Level 2 (Red) 1 set with each color    Retraction  Both;5 reps      Shoulder Exercises: ROM/Strengthening   Nustep  L2: arms only x 5 min       Shoulder Exercises: Stretch   Other Shoulder Stretches  unilateral doorway stretch with L elbow stretch x 20 sec x 2 reps at mid-level, 1 rep and low and high level.     Other Shoulder Stretches  shoulder ext stretch, bilat holding yellow band behind back, 5 sec holds x 5 reps      Modalities   Modalities  -- pt declined ice; ionto held til  next visit      Manual Therapy   Manual therapy comments  Reg rock tape applied with 25% stretch to ant / posterior shoulder (deltoid) to increase proprioception, decompress tissue, decrease pain.         PT Long Term Goals - 11/02/17 0844      PT LONG TERM GOAL #1   Title  I with advanced HEP for shoulder and upper back ( 11/30/17)     Time  4    Period  Weeks    Status  New      PT LONG TERM GOAL #2   Title  report no pain in Lt shoulder with running ( 11/30/17)     Time  4    Period  Weeks    Status  New      PT LONG TERM GOAL #3   Title  demo painfree full Lt shoulder ROM to allow her to dry her hair without pain ( 11/30/17)     Time  4    Period  Weeks      PT LONG TERM GOAL #4   Title  improve Lt shoulder strength =/> 5-/5 without pain ( 11/30/17)     Time  4    Period  Weeks    Status  New      PT LONG TERM GOAL #5   Title  improve FOTO =/< 27% limited ( 11/30/17)     Time  4    Period  Weeks    Status  New            Plan -  11/05/17 1801    Clinical Impression Statement  Pt continues to have painful Lt shoulder motions, especially horiz abd and IR.  She was unable to tolerate doorway stretch with elbows flexed, but had improvement with elbow straight.  She tolerated all other exercises well, with minimal increase in symptoms. Trial of Reg rock tape applied to Lt shoulder.  Progressing towards goals.     Rehab Potential  Excellent    PT Frequency  2x / week    PT Duration  4 weeks pt requested 2 wk trial.     PT Treatment/Interventions  Iontophoresis 4mg /ml Dexamethasone;Dry needling;Manual techniques;Moist Heat;Taping;Patient/family education;Ultrasound;Cryotherapy;Electrical Stimulation;Passive range of motion    PT Next Visit Plan  assess response to Rock tape, continue scapular strengthening, manual therapy.     Consulted and Agree with Plan of Care  Patient       Patient will benefit from skilled therapeutic intervention in order to improve the following deficits and impairments:  Pain, Impaired UE functional use, Decreased strength, Decreased range of motion  Visit Diagnosis: Acute pain of left shoulder  Muscle weakness (generalized)  Stiffness of left shoulder, not elsewhere classified     Problem List Patient Active Problem List   Diagnosis Date Noted  . Rotator cuff tendonitis, left 10/14/2017  . Biological false positive RPR test 09/28/2012   Mayer Camel, PTA 11/05/17 6:39 PM  [facility] [place] [address] [place], [place], [address] Phone: [phone]   Fax:  [phone]  Name: Casey Fields MRN: [id number] Date of Birth: Sep 09, 1980

## 2017-11-05 NOTE — Patient Instructions (Signed)
*   self massage with ball.   CHEST: Doorway, Unilateral - Sitting    Sitting in doorway, place one hand on wall with elbow bent at shoulder height. Lean forward. Hold ___ seconds. ___ reps per set, ___ sets per day, ___ days per week  Copyright  VHI. All rights reserved.

## 2017-11-10 ENCOUNTER — Ambulatory Visit: Payer: BLUE CROSS/BLUE SHIELD | Admitting: Physical Therapy

## 2017-11-10 ENCOUNTER — Encounter: Payer: Self-pay | Admitting: Physical Therapy

## 2017-11-10 DIAGNOSIS — M25512 Pain in left shoulder: Secondary | ICD-10-CM

## 2017-11-10 DIAGNOSIS — M6281 Muscle weakness (generalized): Secondary | ICD-10-CM | POA: Diagnosis not present

## 2017-11-10 DIAGNOSIS — M25612 Stiffness of left shoulder, not elsewhere classified: Secondary | ICD-10-CM

## 2017-11-10 NOTE — Therapy (Signed)
[place], [place], [address] Phone: [phone]   Fax:  [phone]  Physical Therapy Treatment  Patient Details  Name: Casey Fields MRN: [id number] Date of Birth: [birth year] Referring Provider: Dr. Lynne Leader   Encounter Date: 11/10/2017  PT End of Session - 11/10/17 0804    Visit Number  3    Number of Visits  8    Date for PT Re-Evaluation  11/30/17    PT Start Time  0804    PT Stop Time  0843    PT Time Calculation (min)  39 min    Activity Tolerance  Patient tolerated treatment well       Past Medical History:  Diagnosis Date  . Abnormal Pap smear age 89   +HPV; Repeats WNL  . Depression 2001   Hx of   . H/O LEEP (loop electrosurgical excision procedure) of cervix complicating pregnancy Age 13  . Seasonal allergies     Past Surgical History:  Procedure Laterality Date  . EYE SURGERY Bilateral 2003   retinal reattachment  . WISDOM TOOTH EXTRACTION  2000    There were no vitals filed for this visit.  Subjective Assessment - 11/10/17 0806    Subjective  Pt reports the tape helped, she can tell a little difference when it came off.  She has noticed that she is able to move it better    Patient Stated Goals  get out of pain and dry her hair by herself.  Return to regular exercise.     Currently in Pain?  No/denies         [facility] PT Assessment - 11/10/17 0001      Assessment   Medical Diagnosis  Lt RTC tendonitis      AROM   Right/Left Shoulder  -- WNL no pain      Strength   Left Shoulder Flexion  4/5    Left Shoulder ABduction  -- 5-/5    Left Shoulder External Rotation  4+/5    Left Shoulder Horizontal ABduction  5/5                   [facility] Adult PT Treatment/Exercise - 11/10/17 0001      Shoulder Exercises: Seated   Retraction  Strengthening;Both;20 reps;Theraband    Theraband Level (Shoulder Retraction)  Level 2 (Red)      Shoulder Exercises:  Prone   Retraction  Strengthening;Left;12 reps;Weights off EOB    Retraction Weight (lbs)  1    Extension  Strengthening;Left;12 reps;Weights off EOB    Extension Weight (lbs)  2    Other Prone Exercises  POE serratus pushes x 12      Shoulder Exercises: Standing   Other Standing Exercises  plank  on wall - walking hands up down       Shoulder Exercises: Pulleys   Flexion  2 minutes    Scaption  2 minutes      Shoulder Exercises: ROM/Strengthening   Other ROM/Strengthening Exercises  10 reps each shoulder ext with dowel behind back then lifts, moved hands in and repeated - some discomfort with elevating.       Shoulder Exercises: Stretch   Other Shoulder Stretches  doorway stretches with strap      Shoulder Exercises: Body Blade   Flexion  30 seconds;3 reps Lt UE until fatigue      Manual Therapy   Manual therapy comments  Reg rock tape applied with  25% stretch to ant / posterior shoulder (deltoid) to increase proprioception, decompress tissue, decrease pain.  used sensitive tape due t slight irritation before.                   PT Long Term Goals - 11/10/17 8004      PT LONG TERM GOAL #1   Title  I with advanced HEP for shoulder and upper back ( 11/30/17)     Status  On-going      PT LONG TERM GOAL #2   Title  report no pain in Lt shoulder with running ( 11/30/17)     Status  Achieved improving, no soreness until 20-30 min in      PT LONG TERM GOAL #3   Title  demo painfree full Lt shoulder ROM to allow her to dry her hair without pain ( 11/30/17)     Status  On-going hasn't tried using her roll brush yet, ok with out using that      PT LONG TERM GOAL #4   Title  improve Lt shoulder strength =/> 5-/5 without pain ( 11/30/17)     Status  Partially Met      PT LONG TERM GOAL #5   Title  improve FOTO =/< 27% limited ( 11/30/17)     Status  On-going            Plan - 11/10/17 0906    Clinical Impression Statement  Casey Fields is making progress to her goals,  shoulder ROM is improved and she has less pain.  There is still weakness and the Lt shoulder fatigues quickly.  She had a great response with the tape, may want to learn how to apply herself.     Rehab Potential  Excellent    PT Frequency  2x / week    PT Duration  4 weeks    PT Treatment/Interventions  Iontophoresis 59m/ml Dexamethasone;Dry needling;Manual techniques;Moist Heat;Taping;Patient/family education;Ultrasound;Cryotherapy;Electrical Stimulation;Passive range of motion    PT Next Visit Plan  scapular stability and RTC strengthening, modalities/taping PRN    Consulted and Agree with Plan of Care  Patient       Patient will benefit from skilled therapeutic intervention in order to improve the following deficits and impairments:  Pain, Impaired UE functional use, Decreased strength, Decreased range of motion  Visit Diagnosis: Acute pain of left shoulder  Muscle weakness (generalized)  Stiffness of left shoulder, not elsewhere classified     Problem List Patient Active Problem List   Diagnosis Date Noted  . Rotator cuff tendonitis, left 10/14/2017  . Biological false positive RPR test 09/28/2012    SManuela Schwartzshaver PT  11/10/2017, 9:08 AM  [facility] [address]Phone: [phone]  Fax:  [phone] Name: Casey FEUERBORNMRN: [id number] of Birth: 106/25/1982

## 2017-11-12 ENCOUNTER — Ambulatory Visit: Payer: BLUE CROSS/BLUE SHIELD | Admitting: Physical Therapy

## 2017-11-12 DIAGNOSIS — M6281 Muscle weakness (generalized): Secondary | ICD-10-CM

## 2017-11-12 DIAGNOSIS — M25512 Pain in left shoulder: Secondary | ICD-10-CM | POA: Diagnosis not present

## 2017-11-12 DIAGNOSIS — M25612 Stiffness of left shoulder, not elsewhere classified: Secondary | ICD-10-CM

## 2017-11-12 NOTE — Therapy (Signed)
[place], [place], [address] Phone: [phone]   Fax:  [phone]  Physical Therapy Treatment  Patient Details  Name: Casey Fields MRN: [id number] Date of Birth: [birth year] Referring Provider: Dr. Lynne Leader    Encounter Date: 11/12/2017  PT End of Session - 11/12/17 1706    Visit Number  38    Number of Visits  8    Date for PT Re-Evaluation  11/30/17    PT Start Time  1703    PT Stop Time  1745    PT Time Calculation (min)  42 min    Activity Tolerance  Patient tolerated treatment well    Behavior During Therapy  [facility] for tasks assessed/performed       Past Medical History:  Diagnosis Date  . Abnormal Pap smear age 37   +HPV; Repeats WNL  . Depression 2001   Hx of   . H/O LEEP (loop electrosurgical excision procedure) of cervix complicating pregnancy Age 37  . Seasonal allergies     Past Surgical History:  Procedure Laterality Date  . EYE SURGERY Bilateral 2003   retinal reattachment  . WISDOM TOOTH EXTRACTION  2000    There were no vitals filed for this visit.  Subjective Assessment - 11/12/17 1707    Subjective  Pt reports she "got a good work out". She is sore in her Lt shoulder from the last visit.  She is "consciously aware of posture" since last visit; always trying to adjust posture and now using standing desk more at work.     Patient Stated Goals  get out of pain and dry her hair by herself.  Return to regular exercise.     Currently in Pain?  Yes    Pain Score  1     Pain Location  Shoulder    Pain Orientation  Right    Pain Descriptors / Indicators  Sore    Aggravating Factors   overhead work (blow drying hair)    Pain Relieving Factors  ?         [facility] PT Assessment - 11/12/17 0001      Assessment   Medical Diagnosis  Lt RTC tendonitis    Referring Provider  Dr. Lynne Leader     Onset Date/Surgical Date  08/04/17    Hand Dominance  Right    Next MD Visit   PRN      [facility] Adult PT Treatment/Exercise - 11/12/17 0001      Self-Care   Other Self-Care Comments   Pt educated on safe removal and application technique of Rock tape.  Pt verbalized understanding.       Lumbar Exercises: Quadruped   Single Arm Raise  Right;Left;10 reps    Opposite Arm/Leg Raise  Right arm/Left leg;Left arm/Right leg;5 reps    Other Quadruped Lumbar Exercises  plank on knees position x 15 sec      Shoulder Exercises: Standing   External Rotation  Strengthening;Both;10 reps;Theraband 2 sets    Theraband Level (Shoulder External Rotation)  Level 2 (Red)    Internal Rotation  Strengthening;Both;10 reps;Theraband 2 sets    Theraband Level (Shoulder Internal Rotation)  Level 2 (Red)    Row  Both;10 reps;Theraband 2 sets    Theraband Level (Shoulder Row)  Level 3 (Green)      Shoulder Exercises: ROM/Strengthening   UBE (Upper Arm Bike)  L1: 1.5 min each way, seated      Shoulder  Exercises: Stretch   Other Shoulder Stretches  unilateral doorway stretch with L elbow straight x 20 sec x 2 reps at mid-level, 2 reps  bilat low position, trial of unilateral in high position (stopped due to pain)      Manual Therapy   Manual Therapy  Taping;Soft tissue mobilization    Manual therapy comments  Sensitive skin rock tape applied with 25% stretch to Lt prox bicep and infraspinatus to increase proprioception, decompress tissue, decrease pain.  used sensitive tape due t slight irritation before.     Soft tissue mobilization  Edge tool assistance to Lt bicep, ant deltoid, and infraspinatus/teres minor to decrease fascial adhesions and increase ROM.              PT Education - 11/12/17 1818    Education provided  Yes    Education Details  ktape application     Person(s) Educated  Patient    Methods  Explanation;Demonstration;Verbal cues    Comprehension  Verbalized understanding          PT Long Term Goals - 11/10/17 0817      PT LONG TERM GOAL #1   Title  I with  advanced HEP for shoulder and upper back ( 11/30/17)     Status  On-going      PT LONG TERM GOAL #2   Title  report no pain in Lt shoulder with running ( 11/30/17)     Status  Achieved improving, no soreness until 20-30 min in      PT LONG TERM GOAL #3   Title  demo painfree full Lt shoulder ROM to allow her to dry her hair without pain ( 11/30/17)     Status  On-going hasn't tried using her roll brush yet, ok with out using that      PT LONG TERM GOAL #4   Title  improve Lt shoulder strength =/> 5-/5 without pain ( 11/30/17)     Status  Partially Met      PT LONG TERM GOAL #5   Title  improve FOTO =/< 27% limited ( 11/30/17)     Status  On-going            Plan - 11/12/17 1818    Clinical Impression Statement  Pt tolerated increased resistance with Lt shoulder exercises, as well as quadruped position today.  Her Lt shoulder IR ROM has improved to where she can now reach her thumb to T8 without much pain.  Pt instructed in tape application today. She had fascial tightness in her Lt bicep tendon and infraspinatus; decreased with IASTM.  Pt progressing well towards goals.     Rehab Potential  Excellent    PT Frequency  2x / week    PT Duration  4 weeks    PT Treatment/Interventions  Iontophoresis 60m/ml Dexamethasone;Dry needling;Manual techniques;Moist Heat;Taping;Patient/family education;Ultrasound;Cryotherapy;Electrical Stimulation;Passive range of motion    PT Next Visit Plan  continue Lt RTC strengthening.  repeat STM if helpful.     Consulted and Agree with Plan of Care  Patient       Patient will benefit from skilled therapeutic intervention in order to improve the following deficits and impairments:  Pain, Impaired UE functional use, Decreased strength, Decreased range of motion  Visit Diagnosis: Acute pain of left shoulder  Muscle weakness (generalized)  Stiffness of left shoulder, not elsewhere classified     Problem List Patient Active Problem List   Diagnosis  Date Noted  . Rotator cuff tendonitis,  left 10/14/2017  . Biological false positive RPR test 09/28/2012   Kerin Perna, PTA 11/12/17 6:22 PM  [place], [place], [address] Phone: [phone]   Fax:  [phone]  Name: Casey Fields MRN: [id number] Date of Birth: 1981-04-29

## 2017-11-18 ENCOUNTER — Ambulatory Visit: Payer: BLUE CROSS/BLUE SHIELD | Admitting: Physical Therapy

## 2017-11-18 DIAGNOSIS — M25612 Stiffness of left shoulder, not elsewhere classified: Secondary | ICD-10-CM

## 2017-11-18 DIAGNOSIS — M6281 Muscle weakness (generalized): Secondary | ICD-10-CM | POA: Diagnosis not present

## 2017-11-18 DIAGNOSIS — M25512 Pain in left shoulder: Secondary | ICD-10-CM

## 2017-11-18 NOTE — Therapy (Signed)
[place], [place], [address] Phone: [phone]   Fax:  [phone]  Physical Therapy Treatment  Patient Details  Name: Casey Fields MRN: [id number] Date of Birth: 1980-11-27 Referring Provider: Dr. Lynne Leader   Encounter Date: 11/18/2017  PT End of Session - 11/18/17 0807    Visit Number  5    Number of Visits  8    Date for PT Re-Evaluation  11/30/17    PT Start Time  0804    PT Stop Time  4270    PT Time Calculation (min)  40 min       Past Medical History:  Diagnosis Date  . Abnormal Pap smear age 70   +HPV; Repeats WNL  . Depression 2001   Hx of   . H/O LEEP (loop electrosurgical excision procedure) of cervix complicating pregnancy Age 61  . Seasonal allergies     Past Surgical History:  Procedure Laterality Date  . EYE SURGERY Bilateral 2003   retinal reattachment  . WISDOM TOOTH EXTRACTION  2000    There were no vitals filed for this visit.  Subjective Assessment - 11/18/17 0807    Subjective  Pt reports her Lt shoulder felt really good for 2 days after last session.  But then she did something to tweak it twice  this weekend (unsure motion).  Overall she feels like her motion and strength in LUE have improved.     Patient Stated Goals  get out of pain and dry her hair by herself.  Return to regular exercise.     Currently in Pain?  No/denies    Pain Score  0-No pain    Aggravating Factors   overhead work (blow drying hair)    Pain Relieving Factors  ?         [facility] PT Assessment - 11/18/17 0001      Assessment   Medical Diagnosis  Lt RTC tendonitis    Referring Provider  Dr. Lynne Leader    Onset Date/Surgical Date  08/04/17    Hand Dominance  Right    Next MD Visit  PRN      Strength   Left Shoulder Flexion  4+/5    Left Shoulder ABduction  4+/5    Left Shoulder External Rotation  -- 5-/5       [facility] Adult PT Treatment/Exercise - 11/18/17 0001      Shoulder  Exercises: Seated   Flexion  Left;5 reps;Weights    Flexion Weight (lbs)  1# for 1 set, 2# for 2 sets of 5 reps      Shoulder Exercises: Sidelying   External Rotation  Strengthening;Left;10 reps;Weights 2nd    External Rotation Weight (lbs)  2    Other Sidelying Exercises  empty can small range, 2# x 10, 2nd set with 1#      Shoulder Exercises: Standing   Row  Both;10 reps;Theraband 2 sets    Theraband Level (Shoulder Row)  Level 3 (Green)      Shoulder Exercises: ROM/Strengthening   UBE (Upper Arm Bike)  L2: 2 min each direction, standing.      Shoulder Exercises: Stretch   Other Shoulder Stretches  unilateral doorway stretch with L elbow straight x 20 sec, 2 reps (at 90 deg abdct), 1 rep of high doorway stretch x 15 sec, 2 reps low position,.     Other Shoulder Stretches  shoulder ext stretch, bilat lacing hands behind back, 5 sec holds  x 5 reps      Iontophoresis   Type of Iontophoresis  Dexamethasone    Location  Lt prox bicep tendon     Dose  1.0cc    Time  176mp patch      Manual Therapy   Manual therapy comments  Sensitive skin rock tape applied with 25% stretch to Lt teres minor to increase proprioception, decompress tissue, decrease pain.  used sensitive tape due t slight irritation before.     Soft tissue mobilization  Edge tool assistance to Lt bicep, ant deltoid, and infraspinatus/teres minor to decrease fascial adhesions and increase ROM.                   PT Long Term Goals - 11/18/17 1228      PT LONG TERM GOAL #1   Title  I with advanced HEP for shoulder and upper back ( 11/30/17)     Time  4    Period  Weeks    Status  On-going      PT LONG TERM GOAL #2   Title  report no pain in Lt shoulder with running ( 11/30/17)     Time  4    Period  Weeks    Status  Achieved      PT LONG TERM GOAL #3   Title  demo painfree full Lt shoulder ROM to allow her to dry her hair without pain ( 11/30/17)     Time  4    Period  Weeks    Status  On-going       PT LONG TERM GOAL #4   Title  improve Lt shoulder strength =/> 5-/5 without pain ( 11/30/17)     Time  4    Period  Weeks    Status  Partially Met      PT LONG TERM GOAL #5   Title  improve FOTO =/< 27% limited ( 11/30/17)     Time  4    Period  Weeks    Status  On-going            Plan - 11/18/17 1223    Clinical Impression Statement  Pt demonstrated improved Lt shouder strength.  She continues to have point tenderness in Lt bicep tendon and teres minor with manual therapy.  She tolerated exercises with minimal increase in pain, and reported early fatigue with attempts with increased weight.  Pt is progressing towards goals.    Rehab Potential  Excellent    PT Frequency  2x / week    PT Duration  4 weeks    PT Treatment/Interventions  Iontophoresis 456mml Dexamethasone;Dry needling;Manual techniques;Moist Heat;Taping;Patient/family education;Ultrasound;Cryotherapy;Electrical Stimulation;Passive range of motion    PT Next Visit Plan  assess response of ionto.  continue STM, and RTC strengthening.     Consulted and Agree with Plan of Care  Patient       Patient will benefit from skilled therapeutic intervention in order to improve the following deficits and impairments:  Pain, Impaired UE functional use, Decreased strength, Decreased range of motion  Visit Diagnosis: Acute pain of left shoulder  Muscle weakness (generalized)  Stiffness of left shoulder, not elsewhere classified     Problem List Patient Active Problem List   Diagnosis Date Noted  . Rotator cuff tendonitis, left 10/14/2017  . Biological false positive RPR test 09/28/2012   JeKerin PernaPTA 11/18/17 12:48 PM  [facility] [place] Phone: [phone]   Fax:  [phone]  Name: Casey Fields MRN: [id number] Date of Birth: [birth year]

## 2017-11-20 ENCOUNTER — Encounter: Payer: BLUE CROSS/BLUE SHIELD | Admitting: Physical Therapy

## 2017-11-23 ENCOUNTER — Ambulatory Visit: Payer: BLUE CROSS/BLUE SHIELD | Admitting: Physical Therapy

## 2017-11-23 DIAGNOSIS — M6281 Muscle weakness (generalized): Secondary | ICD-10-CM | POA: Diagnosis not present

## 2017-11-23 DIAGNOSIS — M25612 Stiffness of left shoulder, not elsewhere classified: Secondary | ICD-10-CM | POA: Diagnosis not present

## 2017-11-23 DIAGNOSIS — M25512 Pain in left shoulder: Secondary | ICD-10-CM | POA: Diagnosis not present

## 2017-11-23 NOTE — Therapy (Signed)
[place], [place], [address] Phone: [phone]   Fax:  [phone]  Physical Therapy Treatment  Patient Details  Name: Casey Fields MRN: [id number] Date of Birth: [birth year] Referring Provider: Dr. Lynne Leader   Encounter Date: 11/23/2017  PT End of Session - 11/23/17 1436    Visit Number  6    Number of Visits  8    Date for PT Re-Evaluation  11/30/17    PT Start Time  7078    PT Stop Time  1430    PT Time Calculation (min)  27 min    Activity Tolerance  Patient tolerated treatment well;No increased pain    Behavior During Therapy  WFL for tasks assessed/performed       Past Medical History:  Diagnosis Date  . Abnormal Pap smear age 27   +HPV; Repeats WNL  . Depression 2001   Hx of   . H/O LEEP (loop electrosurgical excision procedure) of cervix complicating pregnancy Age 70  . Seasonal allergies     Past Surgical History:  Procedure Laterality Date  . EYE SURGERY Bilateral 2003   retinal reattachment  . WISDOM TOOTH EXTRACTION  2000    There were no vitals filed for this visit.  Subjective Assessment - 11/23/17 1404    Subjective  Pt reports she feels her Lt shoulder has improved, but it still feels unstable.  She has occasional "twinges" in front of shoulder, but it is not as bad.  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  She also has difficulty reaching into back seat of car without pain in Lt shoulder.    Currently in Pain?  No/denies    Pain Score  0-No pain         [facility] PT Assessment - 11/23/17 0001      Assessment   Medical Diagnosis  Lt RTC tendonitis    Referring Provider  Dr. Lynne Leader    Onset Date/Surgical Date  08/04/17    Hand Dominance  Right    Next MD Visit  PRN      Observation/Other Assessments   Focus on Therapeutic Outcomes (FOTO)   36% limited.       AROM   Right/Left Shoulder  Left WNL, pain  at end range of flex and horiz abdct      Strength   Left Shoulder Flexion  4+/5    Left Shoulder ABduction  -- 5-/5    Left Shoulder External Rotation  4+/5        [facility] Adult PT Treatment/Exercise - 11/23/17 0001      Shoulder Exercises: Stretch   Cross Chest Stretch  2 reps;30 seconds    Other Shoulder Stretches  3 position doorway stretch (middle position with straight elbow), 30 sec x 3 reps each position.  2 sets of middle position.      Other Shoulder Stretches  shoulder ext stretch, bilat lacing hands behind back, 5 sec holds x 5 reps      Iontophoresis   Type of Iontophoresis  Dexamethasone    Location  Lt prox bicep tendon     Dose  1.0 cc    Time  80 mA patch      Manual Therapy   Soft tissue mobilization  Edge tool assistance to Lt bicep, prox bicep ant deltoid, and infraspinatus/teres minor to decrease fascial adhesions and increase ROM.  PT Long Term Goals - 11/18/17 1228      PT LONG TERM GOAL #1   Title  I with advanced HEP for shoulder and upper back ( 11/30/17)     Time  4    Period  Weeks    Status  On-going      PT LONG TERM GOAL #2   Title  report no pain in Lt shoulder with running ( 11/30/17)     Time  4    Period  Weeks    Status  Achieved      PT LONG TERM GOAL #3   Title  demo painfree full Lt shoulder ROM to allow her to dry her hair without pain ( 11/30/17)     Time  4    Period  Weeks    Status  On-going      PT LONG TERM GOAL #4   Title  improve Lt shoulder strength =/> 5-/5 without pain ( 11/30/17)     Time  4    Period  Weeks    Status  Partially Met      PT LONG TERM GOAL #5   Title  improve FOTO =/< 27% limited ( 11/30/17)     Time  4    Period  Weeks    Status  On-going            Plan - 11/23/17 1452    Clinical Impression Statement  Pt's Lt shoulder ABD strength has improved.  She continues to have weakness with Lt shoulder flexion and ER.  She is now able to tolerate the low position of doorway stretch with  minimal pain.  Posterior Lt shoulder tissue had less palpable tightness with manual therapy, however Lt bicep, pec minor and ant deltoid tight and tender.  Pt making gradual progress towards goals and will benefit from continued PT intervention to maximize functional mobility.      Rehab Potential  Excellent    PT Frequency  2x / week    PT Duration  4 weeks    PT Treatment/Interventions  Iontophoresis 4mg/ml Dexamethasone;Dry needling;Manual techniques;Moist Heat;Taping;Patient/family education;Ultrasound;Cryotherapy;Electrical Stimulation;Passive range of motion    PT Next Visit Plan  spoke to supervising PT; will req additional visits from insurance.  If approved, will continue posterior shoulder/rotator cuff strengthening and manual therapy to ant Lt shoulder.     Consulted and Agree with Plan of Care  Patient       Patient will benefit from skilled therapeutic intervention in order to improve the following deficits and impairments:  Pain, Impaired UE functional use, Decreased strength, Decreased range of motion  Visit Diagnosis: Acute pain of left shoulder  Muscle weakness (generalized)  Stiffness of left shoulder, not elsewhere classified     Problem List Patient Active Problem List   Diagnosis Date Noted  . Rotator cuff tendonitis, left 10/14/2017  . Biological false positive RPR test 09/28/2012   Jennifer Carlson-Long, PTA 11/23/17 2:59 PM  [facility] [address] [place], [place], [address] Phone: [phone]   Fax:  [phone]  Name: Irisa H Koudelka MRN: [id number] Date of Birth: 02/23/1981   

## 2017-11-26 ENCOUNTER — Encounter: Payer: BLUE CROSS/BLUE SHIELD | Admitting: Physical Therapy

## 2017-12-01 ENCOUNTER — Ambulatory Visit: Payer: BLUE CROSS/BLUE SHIELD | Admitting: Physical Therapy

## 2017-12-01 DIAGNOSIS — M25612 Stiffness of left shoulder, not elsewhere classified: Secondary | ICD-10-CM | POA: Diagnosis not present

## 2017-12-01 DIAGNOSIS — M25512 Pain in left shoulder: Secondary | ICD-10-CM

## 2017-12-01 DIAGNOSIS — M6281 Muscle weakness (generalized): Secondary | ICD-10-CM | POA: Diagnosis not present

## 2017-12-01 NOTE — Therapy (Addendum)
[place], [place], [address] Phone: [phone]   Fax:  [phone]  Physical Therapy Treatment  Patient Details  Name: IEISHA GAO MRN: [id number] Date of Birth: [birth year] Referring Provider: Dr. Lynne Leader   Encounter Date: 12/01/2017  PT End of Session - 12/01/17 0810    Visit Number  7    Number of Visits  8    PT Start Time  0802    PT Stop Time  0848    PT Time Calculation (min)  46 min    Activity Tolerance  Patient tolerated treatment well;No increased pain    Behavior During Therapy  WFL for tasks assessed/performed       Past Medical History:  Diagnosis Date  . Abnormal Pap smear age 66   +HPV; Repeats WNL  . Depression 2001   Hx of   . H/O LEEP (loop electrosurgical excision procedure) of cervix complicating pregnancy Age 61  . Seasonal allergies     Past Surgical History:  Procedure Laterality Date  . EYE SURGERY Bilateral 2003   retinal reattachment  . WISDOM TOOTH EXTRACTION  2000    There were no vitals filed for this visit.  Subjective Assessment - 12/01/17 0810    Subjective  Stacy reports her Lt shoulder is a little tender since her daughter slept in her Lt arm (in her bed) over last few nights due to illness.  Overall, things have improved. She is able to do her roller brush with fatigue, but no longer painful     Currently in Pain?  No/denies    Pain Score  0-No pain         [facility] PT Assessment - 12/01/17 0001      Assessment   Medical Diagnosis  Lt RTC tendonitis    Referring Provider  Dr. Lynne Leader    Onset Date/Surgical Date  08/04/17    Hand Dominance  Right    Next MD Visit  PRN      AROM   Right/Left Shoulder  -- WNL, no pain, except end range horz abdct.      Strength   Right/Left Shoulder  Left    Left Shoulder Flexion  4+/5 with pain    Left Shoulder Extension  -- 5-/5    Left Shoulder ABduction  -- 5-/5    Left Shoulder Internal  Rotation  4+/5    Left Shoulder External Rotation  -- 5-/5    Left Shoulder Horizontal ABduction  -- 5-/5    Left Shoulder Horizontal ADduction  -- 5-/5, with pain      [facility] Adult PT Treatment/Exercise - 12/01/17 0001      Shoulder Exercises: Seated   Other Seated Exercises  D2 flexion LUE with green band x 5 reps      Shoulder Exercises: Prone   Other Prone Exercises  plank - 5 sec, then 15 sec - no pain.       Shoulder Exercises: Sidelying   External Rotation  Strengthening;Left;10 reps;Weights 2 sets    External Rotation Weight (lbs)  2    Other Sidelying Exercises  empty can small range, 2#, 2 sets      Shoulder Exercises: Standing   Extension  Strengthening;Both;10 reps;Theraband    Theraband Level (Shoulder Extension)  Level 3 (Green)    Row  Both;10 reps;Theraband    Theraband Level (Shoulder Row)  Level 3 (Green)    Other Standing Exercises  d2 flexion with  red band x 10 reps each arm, mirror for feedback.       Shoulder Exercises: ROM/Strengthening   UBE (Upper Arm Bike)  L1: 1.5 min each way, standing      Shoulder Exercises: Stretch   Cross Chest Stretch  2 reps;30 seconds    Other Shoulder Stretches  3 position doorway stretch x 2 reps each position.     Other Shoulder Stretches  shoulder ext stretch, bilat lacing hands behind back, 10 sec holds x 2 reps      Manual Therapy   Soft tissue mobilization  Edge tool assistance to Lt bicep, prox bicep ant deltoid, and infraspinatus/teres minor to decrease fascial adhesions and increase ROM.              PT Education - 12/01/17 0901    Education provided  Yes    Education Details  HEP    Person(s) Educated  Patient    Methods  Explanation;Demonstration;Verbal cues;Handout    Comprehension  Verbalized understanding;Returned demonstration          PT Long Term Goals - 12/01/17 0827      PT LONG TERM GOAL #1   Title  I with advanced HEP for shoulder and upper back ( 11/30/17)     Time  4    Period  Weeks     Status  On-going      PT LONG TERM GOAL #2   Title  report no pain in Lt shoulder with running ( 11/30/17)     Time  4    Period  Weeks    Status  Achieved      PT LONG TERM GOAL #3   Title  demo painfree full Lt shoulder ROM to allow her to dry her hair without pain ( 11/30/17)     Time  4    Period  Weeks    Status  Achieved      PT LONG TERM GOAL #4   Title  improve Lt shoulder strength =/> 5-/5 without pain ( 11/30/17)     Time  4    Period  Weeks    Status  Partially Met      PT LONG TERM GOAL #5   Title  improve FOTO =/< 27% limited ( 11/30/17)     Time  4    Period  Weeks    Status  On-going            Plan - 12/01/17 0818    Clinical Impression Statement  Pt 's ROM has improved since initial eval.  She continues to have strength deficits and minor pain with Lt shoulder flexion, ER, and abdct.  She is reporting improved endurance for functional activities.  She was able to tolerate exercises with increased resistance without pain or difficulty.  Pt has partially met her goals and requests to hold therapy while she works on [employer].      Rehab Potential  Excellent    PT Frequency  2x / week    PT Duration  4 weeks    PT Treatment/Interventions  Iontophoresis 80m/ml Dexamethasone;Dry needling;Manual techniques;Moist Heat;Taping;Patient/family education;Ultrasound;Cryotherapy;Electrical Stimulation;Passive range of motion    PT Next Visit Plan  will hold PT for 2 wks per pt request. If pt doesn't return by 5/29, will d/c.     Consulted and Agree with Plan of Care  Patient       Patient will benefit from skilled therapeutic intervention in order to improve the following deficits  and impairments:  Pain, Impaired UE functional use, Decreased strength, Decreased range of motion  Visit Diagnosis: Acute pain of left shoulder  Muscle weakness (generalized)  Stiffness of left shoulder, not elsewhere classified     Problem List Patient Active Problem List   Diagnosis  Date Noted  . Rotator cuff tendonitis, left 10/14/2017  . Biological false positive RPR test 09/28/2012   Kerin Perna, PTA 12/01/17 9:01 AM  [place], [place], [address] Phone: [phone]   Fax:  [phone]  Name: MYKIAH SCHMUCK MRN: [id number] Date of Birth: 1981-04-19  PHYSICAL THERAPY DISCHARGE SUMMARY  Visits from Start of Care: 7  Current functional level related to goals / functional outcomes: See above for function and measurements at last visit   Remaining deficits: Minimal to none. Patient requested to be placed on hold for two weeks, if she was having trouble she was going to schedule more appointments.  She has not so we will discharge.    Education / Equipment: HEP Plan: Patient agrees to discharge.  Patient goals were partially met. Patient is being discharged due to being pleased with the current functional level.  ?????    Jeral Pinch, PT 12/24/17 11:27 AM

## 2017-12-01 NOTE — Patient Instructions (Addendum)
.(  Home) PNF: D2 Flexion - Unilateral    Opposite side toward anchor, right arm down, across body, thumb down, pull arm up and out, rotating to thumb up. Follow hand with head and eyes. Repeat __10__ times per set. Do _2-3___ sets per session. Do ___3-4_ sessions per week.  Progressive Resisted: External Rotation (Side-Lying)    Holding _2-3___ pound weight, towel under arm, raise right forearm toward ceiling. Keep elbow bent and at side. Repeat _10___ times per set. Do __2-3__ sets per session. Do _3-4__ sessions per day.   [facility] [address] [place], [place] [address]  [phone] (office) [phone] (fax)

## 2019-04-27 ENCOUNTER — Other Ambulatory Visit: Payer: Self-pay

## 2019-04-27 DIAGNOSIS — Z20822 Contact with and (suspected) exposure to covid-19: Secondary | ICD-10-CM

## 2019-04-27 DIAGNOSIS — Z20828 Contact with and (suspected) exposure to other viral communicable diseases: Secondary | ICD-10-CM | POA: Diagnosis not present

## 2019-04-28 LAB — NOVEL CORONAVIRUS, NAA: SARS-CoV-2, NAA: NOT DETECTED

## 2019-06-27 ENCOUNTER — Other Ambulatory Visit: Payer: Self-pay

## 2019-06-27 DIAGNOSIS — Z20822 Contact with and (suspected) exposure to covid-19: Secondary | ICD-10-CM

## 2019-06-29 ENCOUNTER — Encounter: Payer: Self-pay | Admitting: *Deleted

## 2019-06-29 LAB — NOVEL CORONAVIRUS, NAA: SARS-CoV-2, NAA: DETECTED — AB

## 2019-10-06 IMAGING — DX DG SHOULDER 2+V*L*
3 series · 3 of 3 positions shown · non-contrast
Comparison: None.

CLINICAL DATA: Left shoulder pain since an injury getting out of a
chair. Initial encounter.

EXAM:
LEFT SHOULDER - 2+ VIEW

[shoulder grashey]
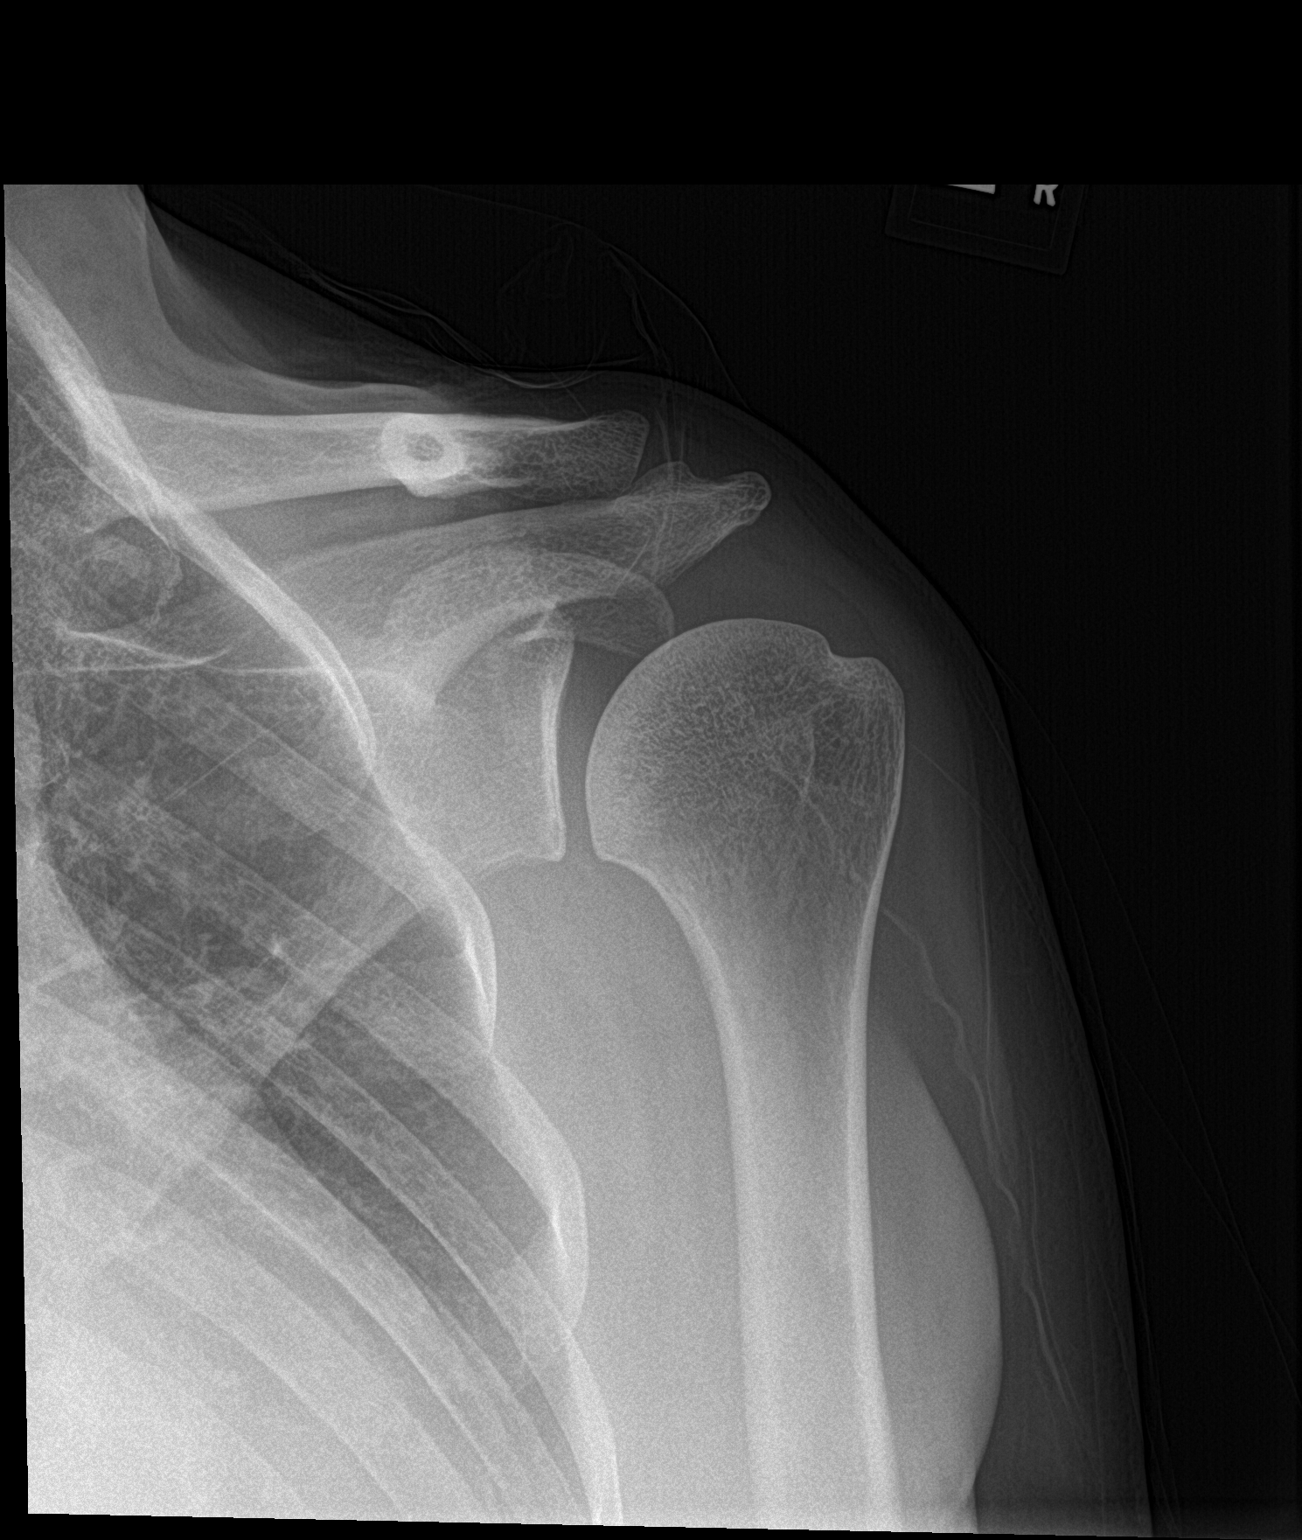

[shoulder y view]
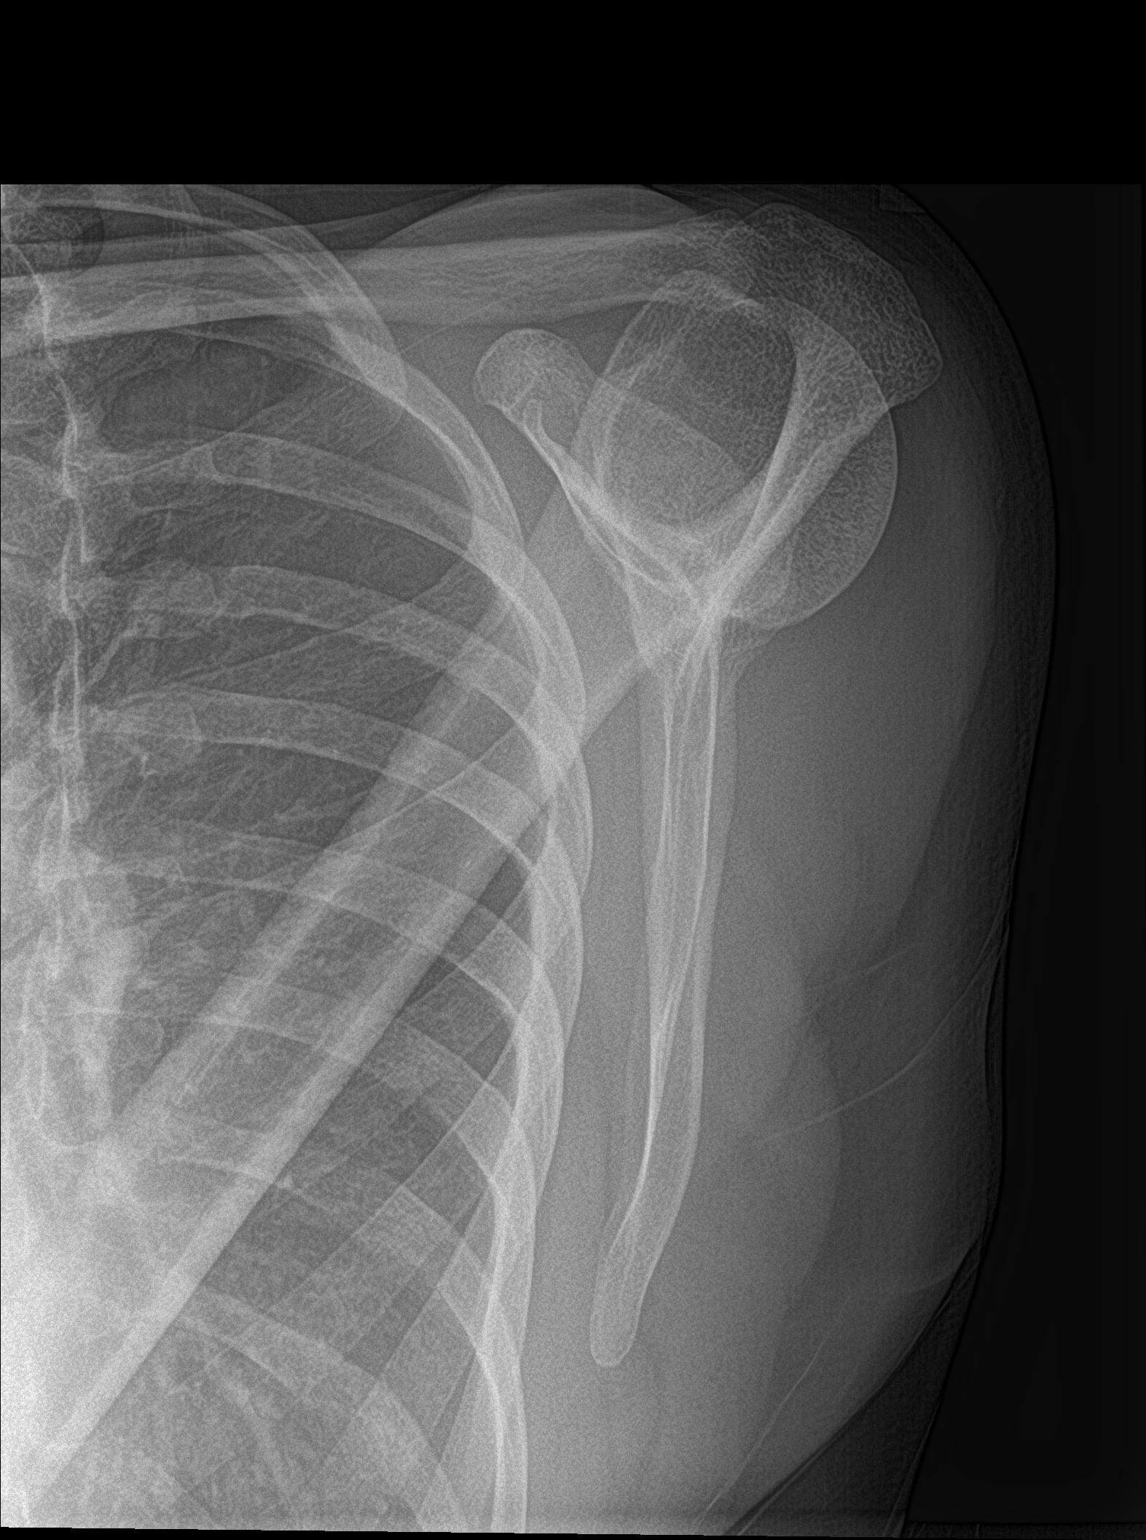

[shoulder axillary]
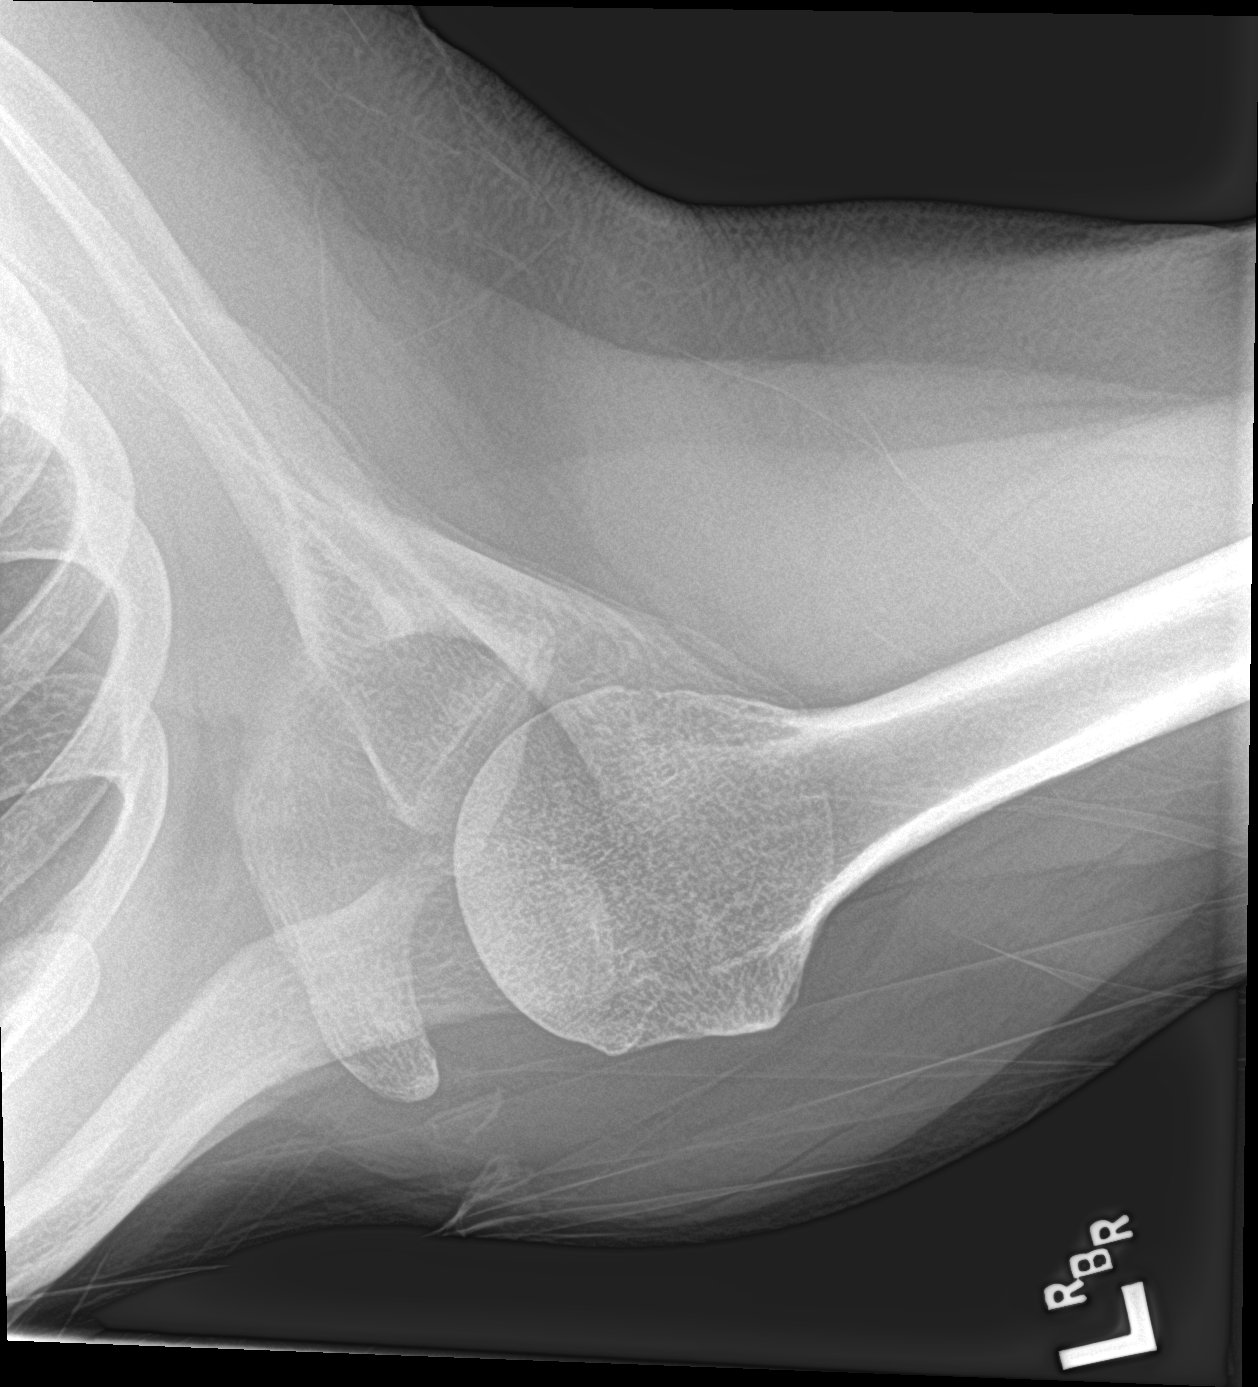

[3 of 3 positions shown; findings below may reference images not displayed]

FINDINGS: There is no evidence of fracture or dislocation. There is no
evidence of arthropathy or other focal bone abnormality. Soft
tissues are unremarkable.
IMPRESSION: Normal exam.

## 2019-12-15 ENCOUNTER — Other Ambulatory Visit: Payer: Self-pay

## 2019-12-15 ENCOUNTER — Ambulatory Visit (INDEPENDENT_AMBULATORY_CARE_PROVIDER_SITE_OTHER): Payer: BC Managed Care – PPO | Admitting: Family Medicine

## 2019-12-15 ENCOUNTER — Other Ambulatory Visit (HOSPITAL_COMMUNITY)
Admission: RE | Admit: 2019-12-15 | Discharge: 2019-12-15 | Disposition: A | Discharge status: Home / Self Care | Payer: BC Managed Care – PPO | Source: Ambulatory Visit | Attending: Family Medicine | Admitting: Family Medicine

## 2019-12-15 ENCOUNTER — Encounter: Payer: Self-pay | Admitting: Family Medicine

## 2019-12-15 DIAGNOSIS — Z1272 Encounter for screening for malignant neoplasm of vagina: Secondary | ICD-10-CM | POA: Diagnosis not present

## 2019-12-15 DIAGNOSIS — Z01419 Encounter for gynecological examination (general) (routine) without abnormal findings: Secondary | ICD-10-CM

## 2019-12-15 DIAGNOSIS — Z124 Encounter for screening for malignant neoplasm of cervix: Secondary | ICD-10-CM | POA: Diagnosis not present

## 2019-12-15 DIAGNOSIS — Z0000 Encounter for general adult medical examination without abnormal findings: Secondary | ICD-10-CM | POA: Diagnosis not present

## 2019-12-15 NOTE — Progress Notes (Signed)
  Subjective:     Casey Fields is a 39 y.o. female and is here for a comprehensive physical exam. The patient reports no problems. Working from home during pandemic. Normal cycles, getting slightly closer together possibly. Husband has had vasectomy.   The following portions of the patient's history were reviewed and updated as appropriate: allergies, current medications, past family history, past medical history, past social history, past surgical history and problem list.  Review of Systems Pertinent items noted in HPI and remainder of comprehensive ROS otherwise negative.   Objective:    BP 112/76   Pulse 89   Ht 5\' 5"  (1.651 m)   Wt 195 lb (88.5 kg)   LMP 12/08/2019 (Exact Date)   BMI 32.45 kg/m  General appearance: alert, cooperative and appears stated age Head: Normocephalic, without obvious abnormality, atraumatic Neck: no adenopathy, supple, symmetrical, trachea midline and thyroid not enlarged, symmetric, no tenderness/mass/nodules Lungs: clear to auscultation bilaterally Breasts: normal appearance, no masses or tenderness Heart: regular rate and rhythm, S1, S2 normal, no murmur, click, rub or gallop Abdomen: soft, non-tender; bowel sounds normal; no masses,  no organomegaly Pelvic: cervix normal in appearance, external genitalia normal, no adnexal masses or tenderness, no cervical motion tenderness, uterus normal size, shape, and consistency and vagina normal without discharge Extremities: extremities normal, atraumatic, no cyanosis or edema Pulses: 2+ and symmetric Skin: Skin color, texture, turgor normal. No rashes or lesions Lymph nodes: Cervical, supraclavicular, and axillary nodes normal. Neurologic: Grossly normal    Assessment:    Healthy female exam.      Plan:      Problem List Items Addressed This Visit    None    Visit Diagnoses    Screening for malignant neoplasm of cervix       Relevant Orders   Cytology - PAP   Encounter for gynecological  examination without abnormal finding       Relevant Orders   CBC   TSH   Hemoglobin A1c   Comprehensive metabolic panel   Lipid panel   VITAMIN D 25 Hydroxy (Vit-D Deficiency, Fractures)   MM DIGITAL SCREENING BILATERAL     Return in 1 year (on 12/14/2020).  See After Visit Summary for Counseling Recommendations

## 2019-12-15 NOTE — Patient Instructions (Signed)
 Preventive Care 40-40 Years Old, Female Preventive care refers to visits with your health care provider and lifestyle choices that can promote health and wellness. This includes:  A yearly physical exam. This may also be called an annual well check.  Regular dental visits and eye exams.  Immunizations.  Screening for certain conditions.  Healthy lifestyle choices, such as eating a healthy diet, getting regular exercise, not using drugs or products that contain nicotine and tobacco, and limiting alcohol use. What can I expect for my preventive care visit? Physical exam Your health care provider will check your:  Height and weight. This may be used to calculate body mass index (BMI), which tells if you are at a healthy weight.  Heart rate and blood pressure.  Skin for abnormal spots. Counseling Your health care provider may ask you questions about your:  Alcohol, tobacco, and drug use.  Emotional well-being.  Home and relationship well-being.  Sexual activity.  Eating habits.  Work and work environment.  Method of birth control.  Menstrual cycle.  Pregnancy history. What immunizations do I need?  Influenza (flu) vaccine  This is recommended every year. Tetanus, diphtheria, and pertussis (Tdap) vaccine  You may need a Td booster every 10 years. Varicella (chickenpox) vaccine  You may need this if you have not been vaccinated. Human papillomavirus (HPV) vaccine  If recommended by your health care provider, you may need three doses over 6 months. Measles, mumps, and rubella (MMR) vaccine  You may need at least one dose of MMR. You may also need a second dose. Meningococcal conjugate (MenACWY) vaccine  One dose is recommended if you are age 19-21 years and a first-year college student living in a residence hall, or if you have one of several medical conditions. You may also need additional booster doses. Pneumococcal conjugate (PCV13) vaccine  You may need  this if you have certain conditions and were not previously vaccinated. Pneumococcal polysaccharide (PPSV23) vaccine  You may need one or two doses if you smoke cigarettes or if you have certain conditions. Hepatitis A vaccine  You may need this if you have certain conditions or if you travel or work in places where you may be exposed to hepatitis A. Hepatitis B vaccine  You may need this if you have certain conditions or if you travel or work in places where you may be exposed to hepatitis B. Haemophilus influenzae type b (Hib) vaccine  You may need this if you have certain conditions. You may receive vaccines as individual doses or as more than one vaccine together in one shot (combination vaccines). Talk with your health care provider about the risks and benefits of combination vaccines. What tests do I need?  Blood tests  Lipid and cholesterol levels. These may be checked every 5 years starting at age 20.  Hepatitis C test.  Hepatitis B test. Screening  Diabetes screening. This is done by checking your blood sugar (glucose) after you have not eaten for a while (fasting).  Sexually transmitted disease (STD) testing.  BRCA-related cancer screening. This may be done if you have a family history of breast, ovarian, tubal, or peritoneal cancers.  Pelvic exam and Pap test. This may be done every 3 years starting at age 21. Starting at age 30, this may be done every 5 years if you have a Pap test in combination with an HPV test. Talk with your health care provider about your test results, treatment options, and if necessary, the need for more   tests. Follow these instructions at home: Eating and drinking   Eat a diet that includes fresh fruits and vegetables, whole grains, lean protein, and low-fat dairy.  Take vitamin and mineral supplements as recommended by your health care provider.  Do not drink alcohol if: ? Your health care provider tells you not to drink. ? You are  pregnant, may be pregnant, or are planning to become pregnant.  If you drink alcohol: ? Limit how much you have to 0-1 drink a day. ? Be aware of how much alcohol is in your drink. In the [place] one drink equals one 12 oz bottle of beer (355 mL), one 5 oz glass of wine (148 mL), or one 1 oz glass of hard liquor (44 mL). Lifestyle  Take daily care of your teeth and gums.  Stay active. Exercise for at least 30 minutes on 5 or more days each week.  Do not use any products that contain nicotine or tobacco, such as cigarettes, e-cigarettes, and chewing tobacco. If you need help quitting, ask your health care provider.  If you are sexually active, practice safe sex. Use a condom or other form of birth control (contraception) in order to prevent pregnancy and STIs (sexually transmitted infections). If you plan to become pregnant, see your health care provider for a preconception visit. What's next?  Visit your health care provider once a year for a well check visit.  Ask your health care provider how often you should have your eyes and teeth checked.  Stay up to date on all vaccines. This information is not intended to replace advice given to you by your health care provider. Make sure you discuss any questions you have with your health care provider. Document Revised: 03/18/2018 Document Reviewed: 03/18/2018 Elsevier Patient Education  2020 Elsevier Inc.  

## 2019-12-16 ENCOUNTER — Telehealth: Payer: Self-pay | Admitting: *Deleted

## 2019-12-16 LAB — CBC
Hematocrit: 40.4 % (ref 34.0–46.6)
Hemoglobin: 13.6 g/dL (ref 11.1–15.9)
MCH: 30.2 pg (ref 26.6–33.0)
MCHC: 33.7 g/dL (ref 31.5–35.7)
MCV: 90 fL (ref 79–97)
Platelets: 237 10*3/uL (ref 150–450)
RBC: 4.51 x10E6/uL (ref 3.77–5.28)
RDW: 11.9 % (ref 11.7–15.4)
WBC: 5 10*3/uL (ref 3.4–10.8)

## 2019-12-16 LAB — LIPID PANEL
Chol/HDL Ratio: 3.2 ratio (ref 0.0–4.4)
Cholesterol, Total: 204 mg/dL — ABNORMAL HIGH (ref 100–199)
HDL: 64 mg/dL (ref >39)
LDL Chol Calc (NIH): 121 mg/dL — ABNORMAL HIGH (ref 0–99)
Triglycerides: 106 mg/dL (ref 0–149)
VLDL Cholesterol Cal: 19 mg/dL (ref 5–40)

## 2019-12-16 LAB — COMPREHENSIVE METABOLIC PANEL
ALT: 17 IU/L (ref 0–32)
AST: 20 IU/L (ref 0–40)
Albumin/Globulin Ratio: 2 (ref 1.2–2.2)
Albumin: 4.7 g/dL (ref 3.8–4.8)
Alkaline Phosphatase: 59 IU/L (ref 48–121)
BUN/Creatinine Ratio: 12 (ref 9–23)
BUN: 14 mg/dL (ref 6–20)
Bilirubin Total: 0.5 mg/dL (ref 0.0–1.2)
CO2: 22 mmol/L (ref 20–29)
Calcium: 9.4 mg/dL (ref 8.7–10.2)
Chloride: 103 mmol/L (ref 96–106)
Creatinine, Ser: 1.18 mg/dL — ABNORMAL HIGH (ref 0.57–1.00)
GFR calc Af Amer: 67 mL/min/{1.73_m2} (ref >59)
GFR calc non Af Amer: 58 mL/min/{1.73_m2} — ABNORMAL LOW (ref >59)
Globulin, Total: 2.4 g/dL (ref 1.5–4.5)
Glucose: 78 mg/dL (ref 65–99)
Potassium: 4.5 mmol/L (ref 3.5–5.2)
Sodium: 141 mmol/L (ref 134–144)
Total Protein: 7.1 g/dL (ref 6.0–8.5)

## 2019-12-16 LAB — CYTOLOGY - PAP
Adequacy: ABSENT
Comment: NEGATIVE
Diagnosis: NEGATIVE
High risk HPV: NEGATIVE

## 2019-12-16 LAB — HEMOGLOBIN A1C
Est. average glucose Bld gHb Est-mCnc: 100 mg/dL
Hgb A1c MFr Bld: 5.1 % (ref 4.8–5.6)

## 2019-12-16 LAB — VITAMIN D 25 HYDROXY (VIT D DEFICIENCY, FRACTURES): Vit D, 25-Hydroxy: 29.3 ng/mL — ABNORMAL LOW (ref 30.0–100.0)

## 2019-12-16 LAB — TSH: TSH: 1.56 u[IU]/mL (ref 0.450–4.500)

## 2019-12-16 NOTE — Telephone Encounter (Signed)
-----   Message from Reva Bores, MD sent at 12/16/2019  8:27 AM EDT ----- Repeat serum Creatinine in 3-4 weeks

## 2019-12-16 NOTE — Telephone Encounter (Signed)
Pt informed of lab result sand recommendations from Dr Shawnie Pons. Pt scheduled for repeat lab work.

## 2020-01-12 ENCOUNTER — Other Ambulatory Visit: Payer: Self-pay

## 2020-01-12 ENCOUNTER — Other Ambulatory Visit: Payer: BC Managed Care – PPO

## 2020-01-12 DIAGNOSIS — R7989 Other specified abnormal findings of blood chemistry: Secondary | ICD-10-CM | POA: Diagnosis not present

## 2020-01-13 LAB — CREATININE, SERUM
Creatinine, Ser: 1.18 mg/dL — ABNORMAL HIGH (ref 0.57–1.00)
GFR calc Af Amer: 67 mL/min/{1.73_m2} (ref >59)
GFR calc non Af Amer: 58 mL/min/{1.73_m2} — ABNORMAL LOW (ref >59)

## 2020-06-11 ENCOUNTER — Ambulatory Visit
Admission: RE | Admit: 2020-06-11 | Discharge: 2020-06-11 | Disposition: A | Discharge status: Home / Self Care | Payer: BC Managed Care – PPO | Source: Ambulatory Visit | Attending: Family Medicine | Admitting: Family Medicine

## 2020-06-11 ENCOUNTER — Other Ambulatory Visit: Payer: Self-pay

## 2020-06-11 DIAGNOSIS — Z01419 Encounter for gynecological examination (general) (routine) without abnormal findings: Secondary | ICD-10-CM

## 2020-06-11 DIAGNOSIS — Z1231 Encounter for screening mammogram for malignant neoplasm of breast: Secondary | ICD-10-CM | POA: Diagnosis not present

## 2020-06-18 ENCOUNTER — Other Ambulatory Visit: Payer: Self-pay | Admitting: Family Medicine

## 2020-06-18 DIAGNOSIS — R928 Other abnormal and inconclusive findings on diagnostic imaging of breast: Secondary | ICD-10-CM

## 2020-06-27 ENCOUNTER — Ambulatory Visit
Admission: RE | Admit: 2020-06-27 | Discharge: 2020-06-27 | Disposition: A | Discharge status: Home / Self Care | Payer: BC Managed Care – PPO | Source: Ambulatory Visit | Attending: Family Medicine | Admitting: Family Medicine

## 2020-06-27 ENCOUNTER — Other Ambulatory Visit: Payer: Self-pay

## 2020-06-27 ENCOUNTER — Ambulatory Visit: Payer: BC Managed Care – PPO

## 2020-06-27 DIAGNOSIS — R928 Other abnormal and inconclusive findings on diagnostic imaging of breast: Secondary | ICD-10-CM

## 2020-10-17 ENCOUNTER — Encounter: Payer: Self-pay | Admitting: Radiology

## 2020-10-30 DIAGNOSIS — M9901 Segmental and somatic dysfunction of cervical region: Secondary | ICD-10-CM | POA: Diagnosis not present

## 2020-10-30 DIAGNOSIS — M9902 Segmental and somatic dysfunction of thoracic region: Secondary | ICD-10-CM | POA: Diagnosis not present

## 2020-10-30 DIAGNOSIS — M9903 Segmental and somatic dysfunction of lumbar region: Secondary | ICD-10-CM | POA: Diagnosis not present

## 2020-10-30 DIAGNOSIS — M9907 Segmental and somatic dysfunction of upper extremity: Secondary | ICD-10-CM | POA: Diagnosis not present

## 2020-10-31 DIAGNOSIS — M9903 Segmental and somatic dysfunction of lumbar region: Secondary | ICD-10-CM | POA: Diagnosis not present

## 2020-10-31 DIAGNOSIS — M9901 Segmental and somatic dysfunction of cervical region: Secondary | ICD-10-CM | POA: Diagnosis not present

## 2020-10-31 DIAGNOSIS — M9902 Segmental and somatic dysfunction of thoracic region: Secondary | ICD-10-CM | POA: Diagnosis not present

## 2020-10-31 DIAGNOSIS — M9907 Segmental and somatic dysfunction of upper extremity: Secondary | ICD-10-CM | POA: Diagnosis not present

## 2020-11-05 DIAGNOSIS — M9901 Segmental and somatic dysfunction of cervical region: Secondary | ICD-10-CM | POA: Diagnosis not present

## 2020-11-05 DIAGNOSIS — M9902 Segmental and somatic dysfunction of thoracic region: Secondary | ICD-10-CM | POA: Diagnosis not present

## 2020-11-05 DIAGNOSIS — M9907 Segmental and somatic dysfunction of upper extremity: Secondary | ICD-10-CM | POA: Diagnosis not present

## 2020-11-05 DIAGNOSIS — M9903 Segmental and somatic dysfunction of lumbar region: Secondary | ICD-10-CM | POA: Diagnosis not present

## 2020-11-06 DIAGNOSIS — M9907 Segmental and somatic dysfunction of upper extremity: Secondary | ICD-10-CM | POA: Diagnosis not present

## 2020-11-06 DIAGNOSIS — M9903 Segmental and somatic dysfunction of lumbar region: Secondary | ICD-10-CM | POA: Diagnosis not present

## 2020-11-06 DIAGNOSIS — M9902 Segmental and somatic dysfunction of thoracic region: Secondary | ICD-10-CM | POA: Diagnosis not present

## 2020-11-06 DIAGNOSIS — M9901 Segmental and somatic dysfunction of cervical region: Secondary | ICD-10-CM | POA: Diagnosis not present

## 2020-11-07 DIAGNOSIS — M9901 Segmental and somatic dysfunction of cervical region: Secondary | ICD-10-CM | POA: Diagnosis not present

## 2020-11-07 DIAGNOSIS — M9902 Segmental and somatic dysfunction of thoracic region: Secondary | ICD-10-CM | POA: Diagnosis not present

## 2020-11-07 DIAGNOSIS — M9907 Segmental and somatic dysfunction of upper extremity: Secondary | ICD-10-CM | POA: Diagnosis not present

## 2020-11-07 DIAGNOSIS — M9903 Segmental and somatic dysfunction of lumbar region: Secondary | ICD-10-CM | POA: Diagnosis not present

## 2020-11-12 DIAGNOSIS — M9901 Segmental and somatic dysfunction of cervical region: Secondary | ICD-10-CM | POA: Diagnosis not present

## 2020-11-12 DIAGNOSIS — M9903 Segmental and somatic dysfunction of lumbar region: Secondary | ICD-10-CM | POA: Diagnosis not present

## 2020-11-12 DIAGNOSIS — M9907 Segmental and somatic dysfunction of upper extremity: Secondary | ICD-10-CM | POA: Diagnosis not present

## 2020-11-12 DIAGNOSIS — M9902 Segmental and somatic dysfunction of thoracic region: Secondary | ICD-10-CM | POA: Diagnosis not present

## 2020-11-13 DIAGNOSIS — M9903 Segmental and somatic dysfunction of lumbar region: Secondary | ICD-10-CM | POA: Diagnosis not present

## 2020-11-13 DIAGNOSIS — M9902 Segmental and somatic dysfunction of thoracic region: Secondary | ICD-10-CM | POA: Diagnosis not present

## 2020-11-13 DIAGNOSIS — M9901 Segmental and somatic dysfunction of cervical region: Secondary | ICD-10-CM | POA: Diagnosis not present

## 2020-11-13 DIAGNOSIS — M9907 Segmental and somatic dysfunction of upper extremity: Secondary | ICD-10-CM | POA: Diagnosis not present

## 2020-11-16 DIAGNOSIS — M9901 Segmental and somatic dysfunction of cervical region: Secondary | ICD-10-CM | POA: Diagnosis not present

## 2020-11-16 DIAGNOSIS — M9903 Segmental and somatic dysfunction of lumbar region: Secondary | ICD-10-CM | POA: Diagnosis not present

## 2020-11-16 DIAGNOSIS — M9902 Segmental and somatic dysfunction of thoracic region: Secondary | ICD-10-CM | POA: Diagnosis not present

## 2020-11-16 DIAGNOSIS — M9907 Segmental and somatic dysfunction of upper extremity: Secondary | ICD-10-CM | POA: Diagnosis not present

## 2020-11-19 DIAGNOSIS — M9901 Segmental and somatic dysfunction of cervical region: Secondary | ICD-10-CM | POA: Diagnosis not present

## 2020-11-19 DIAGNOSIS — M9907 Segmental and somatic dysfunction of upper extremity: Secondary | ICD-10-CM | POA: Diagnosis not present

## 2020-11-19 DIAGNOSIS — M9903 Segmental and somatic dysfunction of lumbar region: Secondary | ICD-10-CM | POA: Diagnosis not present

## 2020-11-19 DIAGNOSIS — M9902 Segmental and somatic dysfunction of thoracic region: Secondary | ICD-10-CM | POA: Diagnosis not present

## 2020-11-20 DIAGNOSIS — M9903 Segmental and somatic dysfunction of lumbar region: Secondary | ICD-10-CM | POA: Diagnosis not present

## 2020-11-20 DIAGNOSIS — M9907 Segmental and somatic dysfunction of upper extremity: Secondary | ICD-10-CM | POA: Diagnosis not present

## 2020-11-20 DIAGNOSIS — M9902 Segmental and somatic dysfunction of thoracic region: Secondary | ICD-10-CM | POA: Diagnosis not present

## 2020-11-20 DIAGNOSIS — M9901 Segmental and somatic dysfunction of cervical region: Secondary | ICD-10-CM | POA: Diagnosis not present

## 2020-11-23 DIAGNOSIS — M9903 Segmental and somatic dysfunction of lumbar region: Secondary | ICD-10-CM | POA: Diagnosis not present

## 2020-11-23 DIAGNOSIS — M9901 Segmental and somatic dysfunction of cervical region: Secondary | ICD-10-CM | POA: Diagnosis not present

## 2020-11-23 DIAGNOSIS — M9907 Segmental and somatic dysfunction of upper extremity: Secondary | ICD-10-CM | POA: Diagnosis not present

## 2020-11-23 DIAGNOSIS — M9902 Segmental and somatic dysfunction of thoracic region: Secondary | ICD-10-CM | POA: Diagnosis not present

## 2020-11-26 DIAGNOSIS — M9901 Segmental and somatic dysfunction of cervical region: Secondary | ICD-10-CM | POA: Diagnosis not present

## 2020-11-26 DIAGNOSIS — M9902 Segmental and somatic dysfunction of thoracic region: Secondary | ICD-10-CM | POA: Diagnosis not present

## 2020-11-26 DIAGNOSIS — M9903 Segmental and somatic dysfunction of lumbar region: Secondary | ICD-10-CM | POA: Diagnosis not present

## 2020-11-26 DIAGNOSIS — M9907 Segmental and somatic dysfunction of upper extremity: Secondary | ICD-10-CM | POA: Diagnosis not present

## 2020-11-27 DIAGNOSIS — M9902 Segmental and somatic dysfunction of thoracic region: Secondary | ICD-10-CM | POA: Diagnosis not present

## 2020-11-27 DIAGNOSIS — M9901 Segmental and somatic dysfunction of cervical region: Secondary | ICD-10-CM | POA: Diagnosis not present

## 2020-11-27 DIAGNOSIS — M9903 Segmental and somatic dysfunction of lumbar region: Secondary | ICD-10-CM | POA: Diagnosis not present

## 2020-11-27 DIAGNOSIS — M9907 Segmental and somatic dysfunction of upper extremity: Secondary | ICD-10-CM | POA: Diagnosis not present

## 2020-12-05 DIAGNOSIS — M9902 Segmental and somatic dysfunction of thoracic region: Secondary | ICD-10-CM | POA: Diagnosis not present

## 2020-12-05 DIAGNOSIS — M9901 Segmental and somatic dysfunction of cervical region: Secondary | ICD-10-CM | POA: Diagnosis not present

## 2020-12-05 DIAGNOSIS — M9907 Segmental and somatic dysfunction of upper extremity: Secondary | ICD-10-CM | POA: Diagnosis not present

## 2020-12-05 DIAGNOSIS — M9903 Segmental and somatic dysfunction of lumbar region: Secondary | ICD-10-CM | POA: Diagnosis not present

## 2020-12-07 DIAGNOSIS — M9903 Segmental and somatic dysfunction of lumbar region: Secondary | ICD-10-CM | POA: Diagnosis not present

## 2020-12-07 DIAGNOSIS — M9902 Segmental and somatic dysfunction of thoracic region: Secondary | ICD-10-CM | POA: Diagnosis not present

## 2020-12-07 DIAGNOSIS — M9901 Segmental and somatic dysfunction of cervical region: Secondary | ICD-10-CM | POA: Diagnosis not present

## 2020-12-07 DIAGNOSIS — M9907 Segmental and somatic dysfunction of upper extremity: Secondary | ICD-10-CM | POA: Diagnosis not present

## 2020-12-18 ENCOUNTER — Telehealth: Payer: Self-pay | Admitting: Radiology

## 2020-12-18 ENCOUNTER — Other Ambulatory Visit: Payer: Self-pay

## 2020-12-18 ENCOUNTER — Encounter: Payer: Self-pay | Admitting: Obstetrics and Gynecology

## 2020-12-18 ENCOUNTER — Other Ambulatory Visit: Payer: Self-pay | Admitting: Obstetrics and Gynecology

## 2020-12-18 ENCOUNTER — Ambulatory Visit (INDEPENDENT_AMBULATORY_CARE_PROVIDER_SITE_OTHER): Payer: BC Managed Care – PPO | Admitting: Obstetrics and Gynecology

## 2020-12-18 VITALS — BP 133/85 | HR 71 | Ht 65.0 in | Wt 203.0 lb

## 2020-12-18 DIAGNOSIS — Z1231 Encounter for screening mammogram for malignant neoplasm of breast: Secondary | ICD-10-CM

## 2020-12-18 DIAGNOSIS — R7989 Other specified abnormal findings of blood chemistry: Secondary | ICD-10-CM | POA: Diagnosis not present

## 2020-12-18 DIAGNOSIS — Z01419 Encounter for gynecological examination (general) (routine) without abnormal findings: Secondary | ICD-10-CM | POA: Diagnosis not present

## 2020-12-18 NOTE — Progress Notes (Signed)
   Neg cyto and hpv 2021

## 2020-12-18 NOTE — Telephone Encounter (Signed)
Left message with Mammo appointment date on 06/12/21 @ 8:30 at [facility]

## 2020-12-18 NOTE — Progress Notes (Signed)
Obstetrics and Gynecology Annual Patient Evaluation  Appointment Date: 12/18/2020  OBGYN Clinic: [facility]  Primary Care Provider: None  Chief Complaint: Annual well woman exam  History of Present Illness: Casey Fields is a 40 y.o. Caucasian G2P2002, seen for the above chief complaint.  Patient denies any complaints or issues for today.    Review of Systems: A comprehensive review of systems was negative.   Past Medical History:  Past Medical History:  Diagnosis Date  . Abnormal Pap smear age 53   +HPV; Repeats WNL  . Biological false positive RPR test 09/28/2012   RPR pos titer 1:1; MHA-TP- negative   . Depression 2001   Hx of   . H/O LEEP (loop electrosurgical excision procedure) of cervix complicating pregnancy Age 4  . Seasonal allergies   . Vaginal Pap smear, abnormal     Past Surgical History:  Past Surgical History:  Procedure Laterality Date  . EYE SURGERY Bilateral 2003   retinal reattachment  . WISDOM TOOTH EXTRACTION  2000    Past Obstetrical History:  OB History  Gravida Para Term Preterm AB Living  2 2 2     2   SAB IAB Ectopic Multiple Live Births          2    # Outcome Date GA Lbr Len/2nd Weight Sex Delivery Anes PTL Lv  2 Term 11/25/12 [redacted]w[redacted]d 85:30 7 lb 11.3 oz (3.495 kg) F Vag-Spont None  LIV  1 Term 2011 [redacted]w[redacted]d 11:00 8 lb 13 oz (3.997 kg) F Vag-Spont   LIV    Past Gynecological History: As per HPI. Periods: qmonth, regular, no intermenstrual bleeding, 4 days, not heavy or painful History of Pap Smear(s): Yes.   Last pap 2021, which was negative cytology and negative HPV She is currently using vasectomy for contraception.   Social History:  Social History   Socioeconomic History  . Marital status: Married    Spouse name: Not on file  . Number of children: Not on file  . Years of education: Not on file  . Highest education level: Not on file  Occupational History  . Occupation: HR MANAGER    Employer:  [employer]  Tobacco Use  . Smoking status: Never Smoker  . Smokeless tobacco: Never Used  Vaping Use  . Vaping Use: Never used  Substance and Sexual Activity  . Alcohol use: Yes    Alcohol/week: 4.0 standard drinks    Types: 4 Standard drinks or equivalent per week  . Drug use: No  . Sexual activity: Yes    Partners: Male    Birth control/protection: Other-see comments    Comment: husband has a vasectomy   Other Topics Concern  . Not on file  Social History Narrative  . Not on file   Social Determinants of Health   Financial Resource Strain: Not on file  Food Insecurity: Not on file  Transportation Needs: Not on file  Physical Activity: Not on file  Stress: Not on file  Social Connections: Not on file  Intimate Partner Violence: Not on file    Family History:  Family History  Problem Relation Age of Onset  . Hypertension Father     Health Maintenance:  Mammogram(s): Yes.   Date: 05/2020, negative  Medications Casey Fields had no medications administered during this visit. Current Outpatient Medications  Medication Sig Dispense Refill  . cetirizine (ZYRTEC) 5 MG tablet Take 5 mg by mouth daily.     No  current facility-administered medications for this visit.    Allergies Penicillins   Physical Exam:  BP 133/85   Pulse 71   Ht 5\' 5"  (1.651 m)   Wt 203 lb (92.1 kg)   BMI 33.78 kg/m  Body mass index is 33.78 kg/m. General appearance: Well nourished, well developed female in no acute distress.  Neck:  Supple, normal appearance, and no thyromegaly  Cardiovascular: normal s1 and s2.  No murmurs, rubs or gallops. Respiratory:  Clear to auscultation bilateral. Normal respiratory effort Abdomen: positive bowel sounds and no masses, hernias; diffusely non tender to palpation, non distended Breasts: breasts appear normal, no suspicious masses, no skin or nipple changes or axillary nodes, and normal palpation . Neuro/Psych:  Normal mood and affect.   Skin:  Warm and dry.  Lymphatic:  No inguinal lymphadenopathy.   Pelvic exam: declined  Laboratory: no new labs  CMP Latest Ref Rng & Units 01/12/2020 12/15/2019 12/06/2015  Glucose 65 - 99 mg/dL - 78 85  BUN 6 - 20 mg/dL - 14 13  Creatinine 12/08/2015 - 1.00 mg/dL 1.94) 1.74(Y) 8.14(G  Sodium 134 - 144 mmol/L - 141 138  Potassium 3.5 - 5.2 mmol/L - 4.5 4.3  Chloride 96 - 106 mmol/L - 103 105  CO2 20 - 29 mmol/L - 22 26  Calcium 8.7 - 10.2 mg/dL - 9.4 8.8  Total Protein 6.0 - 8.5 g/dL - 7.1 6.3  Total Bilirubin 0.0 - 1.2 mg/dL - 0.5 0.4  Alkaline Phos 48 - 121 IU/L - 59 41  AST 0 - 40 IU/L - 20 11  ALT 0 - 32 IU/L - 17 7   Radiology: none  Assessment: pt stable  Plan:  1. Elevated serum creatinine Patient denies any medications except for qday zyrtec. I specifically asked about any NSAID use or supplements. Patient not aware of any elevations prior to last year and she has not seen nephrology. She states that she stays well hydrated with water during the day.  Will do a cmp and renal function panel today  2. Well woman exam No issues.  - MM 3D SCREEN BREAST BILATERAL; Future  RTC 1 year and PRN   8.18 MD Attending [facility])

## 2020-12-18 NOTE — Progress Notes (Signed)
Covid Vaccines X3

## 2020-12-19 LAB — COMPREHENSIVE METABOLIC PANEL
ALT: 13 IU/L (ref 0–32)
AST: 11 IU/L (ref 0–40)
Albumin/Globulin Ratio: 2.1 (ref 1.2–2.2)
Albumin: 4.9 g/dL — ABNORMAL HIGH (ref 3.8–4.8)
Alkaline Phosphatase: 56 IU/L (ref 44–121)
BUN/Creatinine Ratio: 13 (ref 9–23)
BUN: 14 mg/dL (ref 6–24)
Bilirubin Total: 0.4 mg/dL (ref 0.0–1.2)
CO2: 23 mmol/L (ref 20–29)
Calcium: 9.3 mg/dL (ref 8.7–10.2)
Chloride: 104 mmol/L (ref 96–106)
Creatinine, Ser: 1.07 mg/dL — ABNORMAL HIGH (ref 0.57–1.00)
Globulin, Total: 2.3 g/dL (ref 1.5–4.5)
Glucose: 91 mg/dL (ref 65–99)
Potassium: 4.5 mmol/L (ref 3.5–5.2)
Sodium: 140 mmol/L (ref 134–144)
Total Protein: 7.2 g/dL (ref 6.0–8.5)
eGFR: 67 mL/min/{1.73_m2} (ref >59)

## 2020-12-19 LAB — RENAL FUNCTION PANEL: Phosphorus: 3 mg/dL (ref 3.0–4.3)

## 2020-12-20 NOTE — Addendum Note (Signed)
Addended by: [place] Bing on: 12/20/2020 09:49 AM   Modules accepted: Orders

## 2021-03-15 DIAGNOSIS — R7989 Other specified abnormal findings of blood chemistry: Secondary | ICD-10-CM | POA: Diagnosis not present

## 2021-03-15 DIAGNOSIS — M25519 Pain in unspecified shoulder: Secondary | ICD-10-CM | POA: Diagnosis not present

## 2021-06-12 ENCOUNTER — Ambulatory Visit
Admission: RE | Admit: 2021-06-12 | Discharge: 2021-06-12 | Disposition: A | Discharge status: Home / Self Care | Payer: BC Managed Care – PPO | Source: Ambulatory Visit | Attending: Obstetrics and Gynecology | Admitting: Obstetrics and Gynecology

## 2021-06-12 ENCOUNTER — Other Ambulatory Visit: Payer: Self-pay

## 2021-06-12 DIAGNOSIS — Z1231 Encounter for screening mammogram for malignant neoplasm of breast: Secondary | ICD-10-CM | POA: Diagnosis not present

## 2021-06-12 DIAGNOSIS — R7989 Other specified abnormal findings of blood chemistry: Secondary | ICD-10-CM

## 2021-06-12 DIAGNOSIS — Z01419 Encounter for gynecological examination (general) (routine) without abnormal findings: Secondary | ICD-10-CM

## 2021-12-11 ENCOUNTER — Emergency Department (INDEPENDENT_AMBULATORY_CARE_PROVIDER_SITE_OTHER)
Admission: EM | Admit: 2021-12-11 | Discharge: 2021-12-11 | Disposition: A | Discharge status: Home / Self Care | Payer: BC Managed Care – PPO | Source: Home / Self Care

## 2021-12-11 DIAGNOSIS — R059 Cough, unspecified: Secondary | ICD-10-CM

## 2021-12-11 MED ORDER — AZITHROMYCIN 250 MG PO TABS: 250.0000 mg | ORAL_TABLET | Freq: Every day | ORAL | 0 refills | Status: DC

## 2021-12-11 MED ORDER — METHYLPREDNISOLONE 4 MG PO TBPK: ORAL_TABLET | ORAL | 0 refills | Status: DC

## 2021-12-11 MED ORDER — BENZONATATE 200 MG PO CAPS
200.0000 mg | ORAL_CAPSULE | Freq: Three times a day (TID) | ORAL | 0 refills | Status: AC | PRN
Start: 2021-12-11 — End: 2021-12-18

## 2021-12-11 NOTE — ED Triage Notes (Signed)
Pt here today c/o cough  x 1 week. Worsening over the last two days. Cough is productive. Robitussin and mucinex prn. Flonase and zyrtec daily.

## 2021-12-11 NOTE — ED Provider Notes (Signed)
[facility]    CSN: [id number] Arrival date & time: 12/11/21  0804      History   Chief Complaint Chief Complaint  Patient presents with   Cough    HPI Casey Fields is a 41 y.o. female.   HPI 41 year old female presents with cough for 1 week, worsening over the past 2 days reports cough is productive.  Reports using OTC Robitussin, Mucinex, Flonase, Zyrtec daily/as needed.  PMH significant for depression and s/p LEEP procedure of cervix complicating pregnancy.  Past Medical History:  Diagnosis Date   Abnormal Pap smear age 41   +HPV; Repeats WNL   Biological false positive RPR test 09/28/2012   RPR pos titer 1:1; MHA-TP- negative    Depression 2001   Hx of    H/O LEEP (loop electrosurgical excision procedure) of cervix complicating pregnancy Age 61   Seasonal allergies    Vaginal Pap smear, abnormal     Patient Active Problem List   Diagnosis Date Noted   Elevated serum creatinine 12/18/2020   Rotator cuff tendonitis, left 10/14/2017    Past Surgical History:  Procedure Laterality Date   EYE SURGERY Bilateral 2003   retinal reattachment   WISDOM TOOTH EXTRACTION  2000    OB History     Gravida  2   Para  2   Term  2   Preterm      AB      Living  2      SAB      IAB      Ectopic      Multiple      Live Births  2            Home Medications    Prior to Admission medications   Medication Sig Start Date End Date Taking? Authorizing Provider  azithromycin (ZITHROMAX) 250 MG tablet Take 1 tablet (250 mg total) by mouth daily. Take first 2 tablets together, then 1 every day until finished. 12/11/21  Yes Trevor Ihaagan, Chinedu Agustin, FNP  benzonatate (TESSALON) 200 MG capsule Take 1 capsule (200 mg total) by mouth 3 (three) times daily as needed for up to 7 days for cough. 12/11/21 12/18/21 Yes Trevor Ihaagan, Hardeep Reetz, FNP  methylPREDNISolone (MEDROL DOSEPAK) 4 MG TBPK tablet Take as directed 12/11/21  Yes Trevor Ihaagan, Rue Valladares, FNP  cetirizine (ZYRTEC) 5 MG  tablet Take 5 mg by mouth daily.    [provider]    Family History Family History  Problem Relation Age of Onset   Hypertension Father     Social History Social History   Tobacco Use   Smoking status: Never   Smokeless tobacco: Never  Vaping Use   Vaping Use: Never used  Substance Use Topics   Alcohol use: Yes    Alcohol/week: 4.0 standard drinks    Types: 4 Standard drinks or equivalent per week   Drug use: No     Allergies   Penicillins   Review of Systems Review of Systems  Respiratory:  Positive for cough.   All other systems reviewed and are negative.   Physical Exam Triage Vital Signs ED Triage Vitals  Enc Vitals Group     BP 12/11/21 0818 130/81     Pulse Rate 12/11/21 0818 91     Resp 12/11/21 0818 18     Temp 12/11/21 0818 99.1 F (37.3 C)     Temp Source 12/11/21 0818 Oral     SpO2 12/11/21 0818 97 %  Weight --      Height --      Head Circumference --      Peak Flow --      Pain Score 12/11/21 0821 0     Pain Loc --      Pain Edu? --      Excl. in GC? --    No data found.  Updated Vital Signs BP 130/81 (BP Location: Right Arm)   Pulse 91   Temp 99.1 F (37.3 C) (Oral)   Resp 18   LMP  (LMP Unknown)   SpO2 97%    Physical Exam Vitals and nursing note reviewed.  Constitutional:      Appearance: Normal appearance. She is obese.  HENT:     Head: Normocephalic and atraumatic.     Right Ear: Tympanic membrane, ear canal and external ear normal.     Left Ear: Tympanic membrane, ear canal and external ear normal.     Mouth/Throat:     Mouth: Mucous membranes are moist.     Pharynx: Oropharynx is clear.  Eyes:     Extraocular Movements: Extraocular movements intact.     Conjunctiva/sclera: Conjunctivae normal.     Pupils: Pupils are equal, round, and reactive to light.  Cardiovascular:     Rate and Rhythm: Normal rate and regular rhythm.     Pulses: Normal pulses.     Heart sounds: Normal heart sounds.  Pulmonary:      Effort: Pulmonary effort is normal.     Breath sounds: Normal breath sounds. No wheezing, rhonchi or rales.  Musculoskeletal:     Cervical back: Normal range of motion and neck supple.  Skin:    General: Skin is warm and dry.  Neurological:     General: No focal deficit present.     Mental Status: She is alert and oriented to person, place, and time.     UC Treatments / Results  Labs (all labs ordered are listed, but only abnormal results are displayed) Labs Reviewed - No data to display  EKG   Radiology No results found.  Procedures Procedures (including critical care time)  Medications Ordered in UC Medications - No data to display  Initial Impression / Assessment and Plan / UC Course  I have reviewed the triage vital signs and the nursing notes.  Pertinent labs & imaging results that were available during my care of the patient were reviewed by me and considered in my medical decision making (see chart for details).     MDM: 1.  Cough-Rx'd Zithromax, Medrol Dosepak, Tessalon Perles. Instructed patient to take medication as directed with food to completion.  Advised patient to take Medrol Dosepak with Zithromax daily for the next 5 days.  Advised may use Tessalon Perles daily or as needed for cough.  Encouraged patient to increase daily water intake while taking these medications.  Advised patient if symptoms worsen and/or unresolved please follow-up with PCP or here for further evaluation. Final Clinical Impressions(s) / UC Diagnoses   Final diagnoses:  Cough, unspecified type     Discharge Instructions      Instructed patient to take medication as directed with food to completion.  Advised patient to take Medrol Dosepak with Zithromax daily for the next 5 days.  Advised may use Tessalon Perles daily or as needed for cough.  Encouraged patient to increase daily water intake while taking these medications.  Advised patient if symptoms worsen and/or unresolved  please follow-up with PCP or here  for further evaluation.     ED Prescriptions     Medication Sig Dispense Auth. Provider   azithromycin (ZITHROMAX) 250 MG tablet Take 1 tablet (250 mg total) by mouth daily. Take first 2 tablets together, then 1 every day until finished. 6 tablet Trevor Iha, FNP   methylPREDNISolone (MEDROL DOSEPAK) 4 MG TBPK tablet Take as directed 1 each Trevor Iha, FNP   benzonatate (TESSALON) 200 MG capsule Take 1 capsule (200 mg total) by mouth 3 (three) times daily as needed for up to 7 days for cough. 40 capsule Trevor Iha, FNP      PDMP not reviewed this encounter.   Trevor Iha, FNP 12/11/21 0900

## 2021-12-11 NOTE — Discharge Instructions (Addendum)
Instructed patient to take medication as directed with food to completion.  Advised patient to take Medrol Dosepak with Zithromax daily for the next 5 days.  Advised may use Tessalon Perles daily or as needed for cough.  Encouraged patient to increase daily water intake while taking these medications.  Advised patient if symptoms worsen and/or unresolved please follow-up with PCP or here for further evaluation.

## 2022-05-07 ENCOUNTER — Telehealth: Payer: Self-pay

## 2022-05-07 ENCOUNTER — Other Ambulatory Visit: Payer: Self-pay | Admitting: Family Medicine

## 2022-05-07 DIAGNOSIS — Z1231 Encounter for screening mammogram for malignant neoplasm of breast: Secondary | ICD-10-CM

## 2022-05-07 NOTE — Telephone Encounter (Signed)
Attempted to reach patient regarding message left with answering service on 10/18. Left voicemail for patient to call office or send mychart message with details regarding appointment needs.

## 2022-06-19 ENCOUNTER — Ambulatory Visit (INDEPENDENT_AMBULATORY_CARE_PROVIDER_SITE_OTHER): Payer: BC Managed Care – PPO | Admitting: Advanced Practice Midwife

## 2022-06-19 VITALS — BP 113/76 | HR 84 | Wt 195.0 lb

## 2022-06-19 DIAGNOSIS — N6459 Other signs and symptoms in breast: Secondary | ICD-10-CM

## 2022-06-19 DIAGNOSIS — Z0000 Encounter for general adult medical examination without abnormal findings: Secondary | ICD-10-CM

## 2022-06-19 DIAGNOSIS — Z9189 Other specified personal risk factors, not elsewhere classified: Secondary | ICD-10-CM | POA: Diagnosis not present

## 2022-06-19 DIAGNOSIS — Z01419 Encounter for gynecological examination (general) (routine) without abnormal findings: Secondary | ICD-10-CM | POA: Diagnosis not present

## 2022-06-19 NOTE — Progress Notes (Signed)
GYNECOLOGY ANNUAL PREVENTATIVE CARE ENCOUNTER NOTE  History:     Casey Fields is a 41 y.o. G45P2002 female here for a routine annual gynecologic exam.  Current complaints: new onset skin rash along the margin of her bra, at the inferior aspect of her breasts and upper abdomen. She states she switched to new wireless bras and thinks this might be the cause. Rash is not itchy. She denies concerns about breast tissue.   Denies abnormal vaginal bleeding, discharge, pelvic pain, problems with intercourse or other gynecologic concerns.    Gynecologic History Patient's last menstrual period was 06/19/2022. Contraception: vasectomy Last Pap: 12/15/2019. Result was normal with negative HPV Last Mammogram: 05/2021.  Result was normal  Obstetric History OB History  Gravida Para Term Preterm AB Living  2 2 2     2   SAB IAB Ectopic Multiple Live Births          2    # Outcome Date GA Lbr Len/2nd Weight Sex Delivery Anes PTL Lv  2 Term 11/25/12 [redacted]w[redacted]d 85:30 7 lb 11.3 oz (3.495 kg) F Vag-Spont None  LIV  1 Term 2011 [redacted]w[redacted]d 11:00 8 lb 13 oz (3.997 kg) F Vag-Spont   LIV    Past Medical History:  Diagnosis Date   Abnormal Pap smear age 12   +HPV; Repeats WNL   Biological false positive RPR test 09/28/2012   RPR pos titer 1:1; [facility]- negative    Depression 2001   Hx of    H/O LEEP (loop electrosurgical excision procedure) of cervix complicating pregnancy Age 47   Seasonal allergies    Vaginal Pap smear, abnormal     Past Surgical History:  Procedure Laterality Date   EYE SURGERY Bilateral 2003   retinal reattachment   WISDOM TOOTH EXTRACTION  2000    Current Outpatient Medications on File Prior to Visit  Medication Sig Dispense Refill   cetirizine (ZYRTEC) 5 MG tablet Take 5 mg by mouth daily.     fluticasone (FLONASE) 50 MCG/ACT nasal spray Place into both nostrils daily.     azithromycin (ZITHROMAX) 250 MG tablet Take 1 tablet (250 mg total) by mouth daily. Take first 2 tablets  together, then 1 every day until finished. 6 tablet 0   methylPREDNISolone (MEDROL DOSEPAK) 4 MG TBPK tablet Take as directed 1 each 0   No current facility-administered medications on file prior to visit.    Allergies  Allergen Reactions   Penicillins Hives    Social History:  reports that she has never smoked. She has never used smokeless tobacco. She reports current alcohol use of about 4.0 standard drinks of alcohol per week. She reports that she does not use drugs.  Family History  Problem Relation Age of Onset   Hypertension Father     The following portions of the patient's history were reviewed and updated as appropriate: allergies, current medications, past family history, past medical history, past social history, past surgical history and problem list.  Review of Systems Pertinent items noted in HPI and remainder of comprehensive ROS otherwise negative.  Physical Exam:  BP 113/76   Pulse 84   Wt 195 lb (88.5 kg)   LMP 06/19/2022   BMI 32.45 kg/m  CONSTITUTIONAL: Well-developed, well-nourished female in no acute distress.  HENT:  Normocephalic, atraumatic, External right and left ear normal.  EYES: Conjunctivae and EOM are normal. Pupils are equal, round, and reactive to light. No scleral icterus.  NECK: Normal range of motion SKIN: Skin  is warm and dry. No rash noted. Not diaphoretic. No erythema. No pallor. MUSCULOSKELETAL: Normal range of motion. No tenderness.  No cyanosis, clubbing, or edema. NEUROLOGIC: Alert and oriented to person, place, and time. Normal reflexes, muscle tone coordination.  PSYCHIATRIC: Normal mood and affect. Normal behavior. Normal judgment and thought content. CARDIOVASCULAR: Normal heart rate noted, regular rhythm RESPIRATORY: Clear to auscultation bilaterally. Effort and breath sounds normal, no problems with respiration noted. BREASTS: Slight overall asymmetry WNL. No masses, tenderness, skin changes, nipple drainage, or lymphadenopathy  bilaterally. Superficial rash likely heat rash   PELVIC: Deferred   Assessment and Plan:    1. Well woman exam without gynecological exam - No concerning findings beyond skin rash - Previously elevated Serum Creatinine, completed Nephrology referral - CBC - Comprehensive metabolic panel - Lipid panel - TSH - Hemoglobin A1c  2. Breast complaint - Superficial skin rash likely related to heat or moisture from bra fabric - Reviewed interventions including ensuring skin is completely dry, wash bras in sports wash or vinegar rinse, consider thin spots lubricant within inferior crease of breasts to provide moisture barrier  3. Relies on partner vasectomy for contraception    Mammogram scheduled Routine preventative health maintenance measures emphasized. Please refer to After Visit Summary for other counseling recommendations.      Clayton Bibles, MSA, MSN, CNM Certified Nurse Midwife, Biochemist, clinical for [employer], [facility]

## 2022-06-20 LAB — COMPREHENSIVE METABOLIC PANEL
ALT: 8 IU/L (ref 0–32)
AST: 14 IU/L (ref 0–40)
Albumin/Globulin Ratio: 2.2 (ref 1.2–2.2)
Albumin: 4.8 g/dL (ref 3.9–4.9)
Alkaline Phosphatase: 55 IU/L (ref 44–121)
BUN/Creatinine Ratio: 13 (ref 9–23)
BUN: 15 mg/dL (ref 6–24)
Bilirubin Total: 0.5 mg/dL (ref 0.0–1.2)
CO2: 22 mmol/L (ref 20–29)
Calcium: 9 mg/dL (ref 8.7–10.2)
Chloride: 102 mmol/L (ref 96–106)
Creatinine, Ser: 1.18 mg/dL — ABNORMAL HIGH (ref 0.57–1.00)
Globulin, Total: 2.2 g/dL (ref 1.5–4.5)
Glucose: 94 mg/dL (ref 70–99)
Potassium: 4.1 mmol/L (ref 3.5–5.2)
Sodium: 139 mmol/L (ref 134–144)
Total Protein: 7 g/dL (ref 6.0–8.5)
eGFR: 59 mL/min/{1.73_m2} — ABNORMAL LOW (ref >59)

## 2022-06-20 LAB — HEMOGLOBIN A1C
Est. average glucose Bld gHb Est-mCnc: 100 mg/dL
Hgb A1c MFr Bld: 5.1 % (ref 4.8–5.6)

## 2022-06-20 LAB — LIPID PANEL
Chol/HDL Ratio: 3.3 ratio (ref 0.0–4.4)
Cholesterol, Total: 220 mg/dL — ABNORMAL HIGH (ref 100–199)
HDL: 67 mg/dL (ref >39)
LDL Chol Calc (NIH): 136 mg/dL — ABNORMAL HIGH (ref 0–99)
Triglycerides: 96 mg/dL (ref 0–149)
VLDL Cholesterol Cal: 17 mg/dL (ref 5–40)

## 2022-06-20 LAB — TSH: TSH: 1.63 u[IU]/mL (ref 0.450–4.500)

## 2022-06-20 LAB — CBC
Hematocrit: 40.8 % (ref 34.0–46.6)
Hemoglobin: 13.4 g/dL (ref 11.1–15.9)
MCH: 30 pg (ref 26.6–33.0)
MCHC: 32.8 g/dL (ref 31.5–35.7)
MCV: 92 fL (ref 79–97)
Platelets: 245 10*3/uL (ref 150–450)
RBC: 4.46 x10E6/uL (ref 3.77–5.28)
RDW: 11.9 % (ref 11.7–15.4)
WBC: 5.9 10*3/uL (ref 3.4–10.8)

## 2022-06-25 ENCOUNTER — Ambulatory Visit
Admission: RE | Admit: 2022-06-25 | Discharge: 2022-06-25 | Disposition: A | Discharge status: Home / Self Care | Payer: BC Managed Care – PPO | Source: Ambulatory Visit | Attending: Family Medicine | Admitting: Family Medicine

## 2022-06-25 DIAGNOSIS — Z1231 Encounter for screening mammogram for malignant neoplasm of breast: Secondary | ICD-10-CM

## 2023-05-25 ENCOUNTER — Encounter: Payer: Self-pay | Admitting: Family

## 2023-05-25 ENCOUNTER — Ambulatory Visit (INDEPENDENT_AMBULATORY_CARE_PROVIDER_SITE_OTHER): Payer: 59 | Admitting: Family

## 2023-05-25 VITALS — BP 118/76 | HR 79 | Temp 97.8°F | Ht 65.0 in | Wt 204.0 lb

## 2023-05-25 DIAGNOSIS — M25512 Pain in left shoulder: Secondary | ICD-10-CM

## 2023-05-25 DIAGNOSIS — E559 Vitamin D deficiency, unspecified: Secondary | ICD-10-CM | POA: Insufficient documentation

## 2023-05-25 DIAGNOSIS — Z0001 Encounter for general adult medical examination with abnormal findings: Secondary | ICD-10-CM | POA: Insufficient documentation

## 2023-05-25 DIAGNOSIS — N926 Irregular menstruation, unspecified: Secondary | ICD-10-CM

## 2023-05-25 DIAGNOSIS — R413 Other amnesia: Secondary | ICD-10-CM | POA: Insufficient documentation

## 2023-05-25 DIAGNOSIS — Z0000 Encounter for general adult medical examination without abnormal findings: Secondary | ICD-10-CM | POA: Diagnosis not present

## 2023-05-25 DIAGNOSIS — G8929 Other chronic pain: Secondary | ICD-10-CM | POA: Insufficient documentation

## 2023-05-25 DIAGNOSIS — Z23 Encounter for immunization: Secondary | ICD-10-CM | POA: Diagnosis not present

## 2023-05-25 DIAGNOSIS — J301 Allergic rhinitis due to pollen: Secondary | ICD-10-CM | POA: Insufficient documentation

## 2023-05-25 DIAGNOSIS — R7989 Other specified abnormal findings of blood chemistry: Secondary | ICD-10-CM

## 2023-05-25 DIAGNOSIS — E782 Mixed hyperlipidemia: Secondary | ICD-10-CM | POA: Diagnosis not present

## 2023-05-25 DIAGNOSIS — M25611 Stiffness of right shoulder, not elsewhere classified: Secondary | ICD-10-CM | POA: Insufficient documentation

## 2023-05-25 DIAGNOSIS — F411 Generalized anxiety disorder: Secondary | ICD-10-CM | POA: Insufficient documentation

## 2023-05-25 DIAGNOSIS — M25511 Pain in right shoulder: Secondary | ICD-10-CM

## 2023-05-25 DIAGNOSIS — N921 Excessive and frequent menstruation with irregular cycle: Secondary | ICD-10-CM | POA: Insufficient documentation

## 2023-05-25 NOTE — Assessment & Plan Note (Signed)
Stable today, however does go through flares.  Tylenol and voltaren gel prn.  Work on exercises, handout sent to [employer].

## 2023-05-25 NOTE — Assessment & Plan Note (Signed)
Ordered lipid panel, pending results. Work on low cholesterol diet and exercise as tolerated  

## 2023-05-25 NOTE — Assessment & Plan Note (Signed)
Ordered vitamin d pending results.   

## 2023-05-25 NOTE — Assessment & Plan Note (Signed)
Zyrtec prn  

## 2023-05-25 NOTE — Assessment & Plan Note (Signed)
Referral placed for orthopedist due to acute on chronic concerns and limitations of ADLs with flares. Has completed physical therapy with no improvement Conservative measures d/w pt.  Exercises discussed with pt as well.

## 2023-05-25 NOTE — Patient Instructions (Addendum)
  A referral was placed today for orthopedist surgeon as well as for therapy. Please let us know if you have not heard back within 2 weeks about the referral.

## 2023-05-25 NOTE — Assessment & Plan Note (Signed)
Advised to increase hydration, we will check labs  on a day where she has had a good amount of water intake. Be cautious with NSAIDS , although suspect dehydration.

## 2023-05-25 NOTE — Assessment & Plan Note (Signed)
Hormonal labs however suspect this is due to co- menses with her daughters period.

## 2023-05-25 NOTE — Addendum Note (Signed)
Addended by: Alvina Chou on: 05/25/2023 12:40 PM   Modules accepted: Orders

## 2023-05-25 NOTE — Assessment & Plan Note (Signed)

## 2023-05-25 NOTE — Progress Notes (Signed)
Subjective:  Patient ID: Casey Fields, female    DOB: 1980-12-08  Age: 43 y.o. MRN: [id number]  Patient Care Team: Mort Sawyers, FNP as PCP - General (Family Medicine)   CC:  Chief Complaint  Patient presents with   Establish Care    HPI Casey Fields is a 42 y.o. female who presents today for an annual physical exam as well as here to establish care, usually sees GYN instead (Dr. Shawnie Pons). She reports consuming a general diet. The patient does not participate in regular exercise at present. She generally feels well. She reports sleeping fairly well. She does have additional problems to discuss today.   Vision:Within last year Dental:Receives regular dental care  Mammogram: 06/25/22 normal Last pap: 12/15/19  Pt is with acute concerns.   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 Had been diagnosed with left tendonitis of the shoulder and underwent physical therapy which was slightly helpful but then the problems returned. She does state hard for her to hold her shoulders with any weight for a long period of time, especially over her head. The right is worse than the left and the right has limited ROM without pain. The right side has been worsening chronically over the last few years. Left does flare at times, ok today. Doesn't take ibuprofen/nsaids due to kidney 'issues' in the past.   She has seen Dr. Denyse Amass in the past, sports medicine doctor who set her up with rehab. Has had MRI in the past as well with hyperechoic fluid infraspinatus tendonitis  Increased anxiety, work stress, doesn't feel depressed. She has seen a therapist in the past, doesn't feel it is the same thing as depression. Feels more emotional as well, more scatter brained as she has been before. She has to manage a lot of things and hard to remember things at times due to having a lot of stress. Hard for her to fall asleep.  She has had a change in her periods in the last few months, her daughter just got her period and since she had her period two weeks late.   Advanced Directives Patient does not have advanced directives    DEPRESSION SCREENING    05/25/2023   11:36 AM  PHQ 2/9 Scores  PHQ - 2 Score 0  PHQ- 9 Score 9     ROS: Negative unless specifically indicated above in HPI.    Current Outpatient Medications:    cetirizine (ZYRTEC) 5 MG tablet, Take 5 mg by mouth daily., Disp: , Rfl:     Objective:    BP 118/76 (BP Location: Left Arm, Patient Position: Sitting, Cuff Size: Normal)   Pulse 79   Temp 97.8 F (36.6 C) (Temporal)   Ht 5\' 5"  (1.651 m)   Wt 204 lb (92.5 kg)   LMP 05/22/2023 (Exact Date)   SpO2 97%   BMI 33.95 kg/m   BP Readings from Last 3 Encounters:  05/25/23 118/76  06/19/22 113/76  12/11/21 130/81      Physical Exam Constitutional:      General: She is not in acute distress.    Appearance: Normal appearance. She is obese. She is not ill-appearing.  HENT:     Head: Normocephalic.     Right Ear: Tympanic membrane normal.     Left Ear: Tympanic membrane normal.     Nose: Nose normal.  Mouth/Throat:     Mouth: Mucous membranes are moist.  Eyes:     Extraocular Movements: Extraocular movements intact.     Pupils: Pupils are equal, round, and reactive to light.  Cardiovascular:     Rate and Rhythm: Normal rate and regular rhythm.  Pulmonary:     Effort: Pulmonary effort is normal.     Breath sounds: Normal breath sounds.  Abdominal:     General: Abdomen is flat. Bowel sounds are normal.     Palpations: Abdomen is soft.     Tenderness: There is no guarding or rebound.  Musculoskeletal:     Right shoulder: Tenderness (point tenderness to [place] joint) present. No swelling. Decreased range of motion (good ROM however pain with extension).     Left shoulder: Normal. Normal range of motion.     Cervical back: Normal range of motion.  Skin:    General: Skin is  warm.     Capillary Refill: Capillary refill takes less than 2 seconds.  Neurological:     General: No focal deficit present.     Mental Status: She is alert.  Psychiatric:        Mood and Affect: Mood normal.        Behavior: Behavior normal.        Thought Content: Thought content normal.        Judgment: Judgment normal.          Assessment & Plan:  Mixed hyperlipidemia Assessment & Plan: Ordered lipid panel, pending results. Work on low cholesterol diet and exercise as tolerated   Orders: -     Basic metabolic panel; Future -     Lipid panel; Future  Chronic right shoulder pain Assessment & Plan: Referral placed for orthopedist due to acute on chronic concerns and limitations of ADLs with flares. Has completed physical therapy with no improvement Conservative measures d/w pt.  Exercises discussed with pt as well.    Orders: -     Ambulatory referral to Orthopedic Surgery  Decreased range of motion of right shoulder -     Ambulatory referral to Orthopedic Surgery  Chronic left shoulder pain Assessment & Plan: Stable today, however does go through flares.  Tylenol and voltaren gel prn.  Work on exercises, handout sent to [employer].    Orders: -     Ambulatory referral to Orthopedic Surgery  Seasonal allergic rhinitis due to pollen Assessment & Plan: Zyrtec prn   Orders: -     CBC; Future  GAD (generalized anxiety disorder) -     Ambulatory referral to Psychology  Memory deficits -     Vitamin B12; Future -     CBC; Future  Vitamin D deficiency Assessment & Plan: Ordered vitamin d pending results.     Orders: -     VITAMIN D 25 Hydroxy (Vit-D Deficiency, Fractures)  Menses, irregular -     Prolactin; Future -     Testos,Total,Free and SHBG (Female); Future -     TSH; Future -     Follicle stimulating hormone; Future  Elevated serum creatinine Assessment & Plan: Advised to increase hydration, we will check labs  on a day where she has  had a good amount of water intake. Be cautious with NSAIDS , although suspect dehydration.  Orders: -     Basic metabolic panel; Future  Menorrhagia with irregular cycle Assessment & Plan: Hormonal labs however suspect this is due to co- menses with her daughters period.   Orders: -  Prolactin; Future -     Testos,Total,Free and SHBG (Female); Future -     TSH; Future -     Follicle stimulating hormone; Future -     CBC; Future  Encounter for general adult medical examination with abnormal findings Assessment & Plan: Patient Counseling(The following topics were reviewed):  Preventative care handout given to pt  Health maintenance and immunizations reviewed. Please refer to Health maintenance section. Pt advised on safe sex, wearing seatbelts in car, and proper nutrition labwork ordered today for annual Dental health: Discussed importance of regular tooth brushing, flossing, and dental visits.    Total time in clinic with additional concerns in addition to questions for establishing care total an additional 16 minutes.    Follow-up: Return in about 1 year (around 05/24/2024) for f/u CPE.   Mort Sawyers, FNP

## 2023-05-29 ENCOUNTER — Other Ambulatory Visit (INDEPENDENT_AMBULATORY_CARE_PROVIDER_SITE_OTHER): Payer: 59

## 2023-05-29 DIAGNOSIS — N921 Excessive and frequent menstruation with irregular cycle: Secondary | ICD-10-CM | POA: Diagnosis not present

## 2023-05-29 DIAGNOSIS — R413 Other amnesia: Secondary | ICD-10-CM

## 2023-05-29 DIAGNOSIS — E782 Mixed hyperlipidemia: Secondary | ICD-10-CM | POA: Diagnosis not present

## 2023-05-29 DIAGNOSIS — R7989 Other specified abnormal findings of blood chemistry: Secondary | ICD-10-CM | POA: Diagnosis not present

## 2023-05-29 DIAGNOSIS — J301 Allergic rhinitis due to pollen: Secondary | ICD-10-CM

## 2023-05-29 DIAGNOSIS — N926 Irregular menstruation, unspecified: Secondary | ICD-10-CM

## 2023-05-29 DIAGNOSIS — E559 Vitamin D deficiency, unspecified: Secondary | ICD-10-CM

## 2023-05-29 LAB — LIPID PANEL
Cholesterol: 219 mg/dL — ABNORMAL HIGH (ref 0–200)
HDL: 58.6 mg/dL (ref >39.00)
LDL Cholesterol: 137 mg/dL — ABNORMAL HIGH (ref 0–99)
NonHDL: 160.49
Total CHOL/HDL Ratio: 4
Triglycerides: 117 mg/dL (ref 0.0–149.0)
VLDL: 23.4 mg/dL (ref 0.0–40.0)

## 2023-05-29 LAB — CBC
HCT: 40 % (ref 36.0–46.0)
Hemoglobin: 13.5 g/dL (ref 12.0–15.0)
MCHC: 33.8 g/dL (ref 30.0–36.0)
MCV: 90.1 fL (ref 78.0–100.0)
Platelets: 244 10*3/uL (ref 150.0–400.0)
RBC: 4.44 Mil/uL (ref 3.87–5.11)
RDW: 12.4 % (ref 11.5–15.5)
WBC: 5.2 10*3/uL (ref 4.0–10.5)

## 2023-05-29 LAB — FOLLICLE STIMULATING HORMONE: FSH: 6.1 m[IU]/mL

## 2023-05-29 LAB — TSH: TSH: 1.57 u[IU]/mL (ref 0.35–5.50)

## 2023-05-29 LAB — BASIC METABOLIC PANEL
BUN: 15 mg/dL (ref 6–23)
CO2: 27 meq/L (ref 19–32)
Calcium: 9.2 mg/dL (ref 8.4–10.5)
Chloride: 101 meq/L (ref 96–112)
Creatinine, Ser: 1.19 mg/dL (ref 0.40–1.20)
GFR: 56.21 mL/min — ABNORMAL LOW (ref >60.00)
Glucose, Bld: 99 mg/dL (ref 70–99)
Potassium: 4.3 meq/L (ref 3.5–5.1)
Sodium: 136 meq/L (ref 135–145)

## 2023-05-29 LAB — VITAMIN D 25 HYDROXY (VIT D DEFICIENCY, FRACTURES): VITD: 25.17 ng/mL — ABNORMAL LOW (ref 30.00–100.00)

## 2023-05-29 LAB — VITAMIN B12: Vitamin B-12: 147 pg/mL — ABNORMAL LOW (ref 211–911)

## 2023-06-04 LAB — PROLACTIN: Prolactin: 6.4 ng/mL

## 2023-06-04 LAB — TESTOS,TOTAL,FREE AND SHBG (FEMALE)
Free Testosterone: 3.1 pg/mL (ref 0.1–6.4)
Sex Hormone Binding: 36.2 nmol/L (ref 17–124)
Testosterone, Total, LC-MS-MS: 15 ng/dL (ref 2–45)

## 2023-06-05 ENCOUNTER — Other Ambulatory Visit: Payer: Self-pay | Admitting: Family

## 2023-06-05 DIAGNOSIS — Z1231 Encounter for screening mammogram for malignant neoplasm of breast: Secondary | ICD-10-CM

## 2023-06-08 ENCOUNTER — Ambulatory Visit (INDEPENDENT_AMBULATORY_CARE_PROVIDER_SITE_OTHER): Payer: 59

## 2023-06-08 DIAGNOSIS — E538 Deficiency of other specified B group vitamins: Secondary | ICD-10-CM

## 2023-06-08 MED ORDER — CYANOCOBALAMIN 1000 MCG/ML IJ SOLN
1000.0000 ug | Freq: Once | INTRAMUSCULAR | Status: AC
  Administered 2023-06-08: 1000 ug via INTRAMUSCULAR

## 2023-06-08 NOTE — Progress Notes (Signed)
Patient presented for B 12 injection given by Rhylan Kagel, CMA to left deltoid, patient voiced no concerns nor showed any signs of distress during injection.  

## 2023-06-11 ENCOUNTER — Ambulatory Visit (INDEPENDENT_AMBULATORY_CARE_PROVIDER_SITE_OTHER): Payer: 59 | Admitting: Orthopaedic Surgery

## 2023-06-11 DIAGNOSIS — G8929 Other chronic pain: Secondary | ICD-10-CM | POA: Diagnosis not present

## 2023-06-11 DIAGNOSIS — M25512 Pain in left shoulder: Secondary | ICD-10-CM | POA: Diagnosis not present

## 2023-06-11 DIAGNOSIS — M25511 Pain in right shoulder: Secondary | ICD-10-CM | POA: Diagnosis not present

## 2023-06-11 NOTE — Progress Notes (Deleted)
[facility] Counselor/Therapist Progress Note  [name] ID: Casey [name], MRN: [id number]    Date: 06/11/23  Time Spent: ***  {onmampm:26702} - *** {onmampm:26702} : *** Minutes  Treatment Type: Individual Therapy.  Reported Symptoms: ***  Mental Status Exam: Appearance:  {PSY:22683}     Behavior: {PSY:21022743}  Motor: {PSY:22302}  Speech/Language:  {PSY:22685}  Affect: {PSY:22687}  Mood: {PSY:31886}  Thought process: {PSY:31888}  Thought content:   {PSY:331-364-3805}  Sensory/Perceptual disturbances:   {PSY:762-206-9099}  Orientation: {PSY:30297}  Attention: {PSY:22877}  Concentration: {PSY:325-803-1657}  Memory: {PSY:316-279-1506}  Fund of knowledge:  {PSY:325-803-1657}  Insight:   {PSY:325-803-1657}  Judgment:  {PSY:325-803-1657}  Impulse Control: {PSY:325-803-1657}   Risk Assessment: Danger to Self:  {PSY:22692} Self-injurious Behavior: {PSY:22692} Danger to Others: {PSY:22692} Duty to Warn:{PSY:311194} Physical Aggression / Violence:{PSY:21197} Access to Firearms a concern: {PSY:21197} Gang Involvement:{PSY:21197}  Subjective:   Casey [name] participated in Casey session, in person in Casey office with Casey therapist, and consented to treatment. Casey [name] reviewed Casey events of Casey past week. [name] Counselor/Therapist Progress Note  [name] ID: Casey [name], MRN: [id number]    Date: 06/11/23  Time Spent: ***  {onmampm:26702} - *** {onmampm:26702} : *** Minutes  Treatment Type: Individual Therapy.  Reported Symptoms: ***  Mental Status Exam: Appearance:  {PSY:22683}     Behavior: {PSY:21022743}  Motor: {PSY:22302}  Speech/Language:  {PSY:22685}  Affect: {PSY:22687}  Mood: {PSY:31886}  Thought process: {PSY:31888}  Thought content:   {PSY:331-364-3805}  Sensory/Perceptual disturbances:   {PSY:762-206-9099}  Orientation: {PSY:30297}  Attention: {PSY:22877}  Concentration: {PSY:325-803-1657}  Memory: {PSY:316-279-1506}  Fund of knowledge:   {PSY:325-803-1657}  Insight:   {PSY:325-803-1657}  Judgment:  {PSY:325-803-1657}  Impulse Control: {PSY:325-803-1657}   Risk Assessment: Danger to Self:  {PSY:22692} Self-injurious Behavior: {PSY:22692} Danger to Others: {PSY:22692} Duty to Warn:{PSY:311194} Physical Aggression / Violence:{PSY:21197} Access to Firearms a concern: {PSY:21197} Gang Involvement:{PSY:21197}  Subjective:   Casey [name] participated in Casey session, in person in Casey office with Casey therapist, and consented to treatment. Casey [name] reviewed Casey events of Casey past week.   ***   Interventions: {PSY:859-451-6100}  Diagnosis:   No diagnosis found.  Psychiatric Treatment: {YES/NO:21197}, ***  Treatment Plan:  Client Abilities/Strengths Casey [name] ***  Support System: ***  Client Treatment Preferences ***  Client Statement of Needs Casey [name] would like to ***   Treatment Level {Frequency of sessions.:26745}  Symptoms  ***   (Status: {Symptom Status:26744}) ***   (Status: {Symptom Status:26744})  Goals:   Casey [name] experiences symptoms of ***   Target Date: *** Frequency: {Frequency of sessions.:26745}  Progress: 0 Modality: individual    Therapist will provide referrals for additional resources as appropriate.  Therapist will provide psycho-education regarding Casey [name]'s diagnosis and corresponding treatment approaches and interventions. Licensed Clinical Social Worker, [place], LCSW will support Casey [name]'s ability to achieve Casey goals identified. will employ CBT, BA, Problem-solving, Solution Focused, Mindfulness,  coping skills, & other evidenced-based practices will be used to promote progress towards healthy functioning to help manage decrease symptoms associated with {his/her/their:21314} diagnosis.   Reduce overall level, frequency, and intensity of Casey feelings of depression, anxiety and panic evidenced by decreased *** from 6 to 7 days/week to 0 to 1 days/week per client report for at least 3  consecutive months. Verbally express understanding of Casey relationship between feelings of ***depression, ***anxiety and their impact on thinking patterns and behaviors. Verbalize an understanding of Casey role that distorted thinking plays in creating fears, excessive worry, and ruminations.  Casey [name] participated in Casey creation  of Casey treatment plan)      [facility] Counselor/Therapist Progress Note  [name] ID: Casey [name], MRN: [id number]    Date: 06/11/23  Time Spent: ***  {onmampm:26702} - *** {onmampm:26702} : *** Minutes  Treatment Type: Individual Therapy.  Reported Symptoms: ***  Mental Status Exam: Appearance:  {PSY:22683}     Behavior: {PSY:21022743}  Motor: {PSY:22302}  Speech/Language:  {PSY:22685}  Affect: {PSY:22687}  Mood: {PSY:31886}  Thought process: {PSY:31888}  Thought content:   {[phone]}  Sensory/Perceptual disturbances:   {PSY:346-183-3130}  Orientation: {PSY:30297}  Attention: {PSY:22877}  Concentration: {PSY:847 598 3010}  Memory: {PSY:(402)431-6687}  Fund of knowledge:  {PSY:847 598 3010}  Insight:   {PSY:847 598 3010}  Judgment:  {PSY:847 598 3010}  Impulse Control: {PSY:847 598 3010}   Risk Assessment: Danger to Self:  {PSY:22692} Self-injurious Behavior: {PSY:22692} Danger to Others: {PSY:22692} Duty to Warn:{PSY:311194} Physical Aggression / Violence:{PSY:21197} Access to Firearms a concern: {PSY:21197} Gang Involvement:{PSY:21197}  Subjective:   Casey [name] participated from {[name] Location:26691::"home"}, via {ONMVIDEOORPHONE:26699}, and consented to treatment. I discussed Casey limitations of evaluation and management by telemedicine and Casey availability of in person appointments. Casey [name] expressed understanding and agreed to proceed.  Therapist participated from {onmtherapistlocation:26692}.  Ensleigh reviewed Casey events of Casey past week.   ***   Interventions:  {PSY:603-114-3211}  Diagnosis:  No diagnosis found.  Psychiatric Treatment: {YES/NO:21197}, ***  Treatment Plan:  Client Abilities/Strengths Casey [name] ***  Support System: ***  Client Treatment Preferences ***  Client Statement of Needs Casey [name] would like to ***   Treatment Level {Frequency of sessions.:26745}  Symptoms  ***   (Status: {Symptom Status:26744}) ***   (Status: {Symptom Status:26744})  Goals:   Casey [name] experiences symptoms of ***   Target Date: *** Frequency: {Frequency of sessions.:26745}  Progress: 0 Modality: individual    Therapist will provide referrals for additional resources as appropriate.  Therapist will provide psycho-education regarding Casey [name]'s diagnosis and corresponding treatment approaches and interventions. Licensed Clinical Social Worker, [place], LCSW will support Casey [name]'s ability to achieve Casey goals identified. will employ CBT, BA, Problem-solving, Solution Focused, Mindfulness,  coping skills, & other evidenced-based practices will be used to promote progress towards healthy functioning to help manage decrease symptoms associated with {his/her/their:21314} diagnosis.   Reduce overall level, frequency, and intensity of Casey feelings of depression, anxiety and panic evidenced by decreased *** from 6 to 7 days/week to 0 to 1 days/week per client report for at least 3 consecutive months. Verbally express understanding of Casey relationship between feelings of ***depression, ***anxiety and their impact on thinking patterns and behaviors. Verbalize an understanding of Casey role that distorted thinking plays in creating fears, excessive worry, and ruminations.  Casey [name] participated in Casey creation of Casey treatment plan)   [facility] Counselor/Therapist Progress Note  [name] ID: Casey [name], MRN: [id number]    Date: 06/11/23  Time Spent: ***  {onmampm:26702} - *** {onmampm:26702} : ***  Minutes  Treatment Type: Individual Therapy.  Reported Symptoms: ***  Mental Status Exam: Appearance:  {PSY:22683}     Behavior: {PSY:21022743}  Motor: {PSY:22302}  Speech/Language:  {PSY:22685}  Affect: {PSY:22687}  Mood: {PSY:31886}  Thought process: {PSY:31888}  Thought content:   {PSY:([phone]}  Sensory/Perceptual disturbances:   {PSY:346-183-3130}  Orientation: {PSY:30297}  Attention: {PSY:22877}  Concentration: {PSY:847 598 3010}  Memory: {PSY:(402)431-6687}  Fund of knowledge:  {PSY:847 598 3010}  Insight:   {PSY:847 598 3010}  Judgment:  {PSY:847 598 3010}  Impulse Control: {PSY:847 598 3010}   Risk Assessment: Danger to Self:  {PSY:22692} Self-injurious Behavior: {PSY:22692}  Danger to Others: {PSY:22692} Duty to [facility] Physical Aggression / Violence:{PSY:21197} Access to Firearms a concern: {PSY:21197} Gang Involvement:{PSY:21197}  Subjective:   Casey [name] participated in Casey session, in person in Casey office with Casey therapist, and consented to treatment Casey [name] reviewed Casey events of Casey past week.   ***   We reviewed numerous treatment approaches including CBT, BA, Problem Solving, and Solution focused therapy. Psych-education regarding Casey Casey [name]'s diagnosis of No diagnosis found. was provided during Casey session. We discussed Casey [name] goals treatment goals which include ***. Casey [name] provided verbal approval of Casey treatment plan.    Interventions: Psycho-education & Goal Setting.   Diagnosis:   No diagnosis found.  Psychiatric Treatment: {YES/NO:21197}, ***  Treatment Plan:  Client Abilities/Strengths Casey [name] ***  Support System: ***  Client Treatment Preferences ***  Client Statement of Needs Casey [name] would like to ***   Treatment Level {Frequency of sessions.:26745}  Symptoms  ***   (Status: {Symptom Status:26744}) ***   (Status: {Symptom Status:26744})  Goals:   Casey [name] experiences symptoms of ***  Treatment plan  signed and available on s-drive:  {ZOX/WR:60454}    Target Date: *** Frequency: {Frequency of sessions.:26745}  Progress: 0 Modality: individual    Therapist will provide referrals for additional resources as appropriate.  Therapist will provide psycho-education regarding Casey [name]'s diagnosis and corresponding treatment approaches and interventions. Licensed Clinical Social Worker, [place], LCSW will support Casey [name]'s ability to achieve Casey goals identified. will employ CBT, BA, Problem-solving, Solution Focused, Mindfulness,  coping skills, & other evidenced-based practices will be used to promote progress towards healthy functioning to help manage decrease symptoms associated with {his/her/their:21314} diagnosis.   Reduce overall level, frequency, and intensity of Casey feelings of depression, anxiety and panic evidenced by decreased *** from 6 to 7 days/week to 0 to 1 days/week per client report for at least 3 consecutive months. Verbally express understanding of Casey relationship between feelings of ***depression, ***anxiety and their impact on thinking patterns and behaviors. Verbalize an understanding of Casey role that distorted thinking plays in creating fears, excessive worry, and ruminations.  Casey [name] participated in Casey creation of Casey treatment plan)    Kerry Dory       ***   Interventions: {PSY:2152175074}  Diagnosis:   No diagnosis found.  Psychiatric Treatment: {YES/NO:21197}, ***  Treatment Plan:  Client Abilities/Strengths Casey [name] ***  Support System: ***  Client Treatment Preferences ***  Client Statement of Needs Nahlah would like to ***   Treatment Level {Frequency of sessions.:26745}  Symptoms  ***   (Status: {Symptom Status:26744}) ***   (Status: {Symptom Status:26744})  Goals:   Briseidy experiences symptoms of ***   Target Date: *** Frequency: {Frequency of sessions.:26745}  Progress: 0 Modality: individual    Therapist  will provide referrals for additional resources as appropriate.  Therapist will provide psycho-education regarding Alexina's diagnosis and corresponding treatment approaches and interventions. Licensed Clinical Social Worker, [place], LCSW will support Casey [name]'s ability to achieve Casey goals identified. will employ CBT, BA, Problem-solving, Solution Focused, Mindfulness,  coping skills, & other evidenced-based practices will be used to promote progress towards healthy functioning to help manage decrease symptoms associated with {his/her/their:21314} diagnosis.   Reduce overall level, frequency, and intensity of Casey feelings of depression, anxiety and panic evidenced by decreased *** from 6 to 7 days/week to 0 to 1 days/week per client report for at least 3 consecutive months. Verbally express understanding of Casey relationship between feelings of ***depression, ***anxiety and their  impact on thinking patterns and behaviors. Verbalize an understanding of Casey role that distorted thinking plays in creating fears, excessive worry, and ruminations.  Casey [name] participated in Casey creation of Casey treatment plan)      Elvin So Meryl Dare, [facility]

## 2023-06-11 NOTE — Progress Notes (Signed)
Office Visit Note   Patient: Casey [name]           Date of Birth: 15-Sep-1980           MRN: [id number] Visit Date: 06/11/2023              Requested by: Mort Sawyers, FNP [address] [place],  [place] [address] PCP: Mort Sawyers, FNP   Assessment & Plan: Visit Diagnoses:  1. Chronic right shoulder pain   2. Chronic left shoulder pain     Plan: Patient is a 42 year old female with chronic bilateral shoulder pain.  Low suspicion for structural abnormality.  Will obtain arthritis panel to rule out autoimmune condition.  Also discussed that increased stress could cause her pattern of shoulder pain that she is describing.  Also discussed going back to physical therapy and trying cortisone injections.  Home exercise program provided.  We will send her to Dr. Shon Baton for bilateral shoulder injections.  Follow-Up Instructions: No follow-ups on file.   Orders:  Orders Placed This Encounter  Procedures   Uric acid   Sedimentation rate   Antinuclear Antib (ANA)   Rheumatoid Factor   Ambulatory referral to Sports Medicine   No orders of the defined types were placed in this encounter.     Procedures: No procedures performed   Clinical Data: No additional findings.   Subjective: Chief Complaint  Patient presents with   Right Shoulder - Pain   Left Shoulder - Pain    HPI Casey [name] is a 42 year old female who comes in for bilateral shoulder pain greater on the right since 2018.  Right-hand-dominant.  She reports anterior shoulder pain that radiates through to the back.  Denies any neck pain or radicular symptoms.  She reports that she "cannot do anything "such as tennis, play with her kids, put dishes in the cupboard, etc. she had an MRI of her left shoulder in 2019 when she was seen Dr. Denyse Amass.  This MRI was basically normal.  Review of Systems  Constitutional: Negative.   HENT: Negative.    Eyes: Negative.   Respiratory: Negative.    Cardiovascular: Negative.    Endocrine: Negative.   Musculoskeletal: Negative.   Neurological: Negative.   Hematological: Negative.   Psychiatric/Behavioral: Negative.    All other systems reviewed and are negative.    Objective: Vital Signs: LMP 05/22/2023 (Exact Date)   Physical Exam Vitals and nursing note reviewed.  Constitutional:      Appearance: She is well-developed.  HENT:     Head: Atraumatic.     Nose: Nose normal.  Eyes:     Extraocular Movements: Extraocular movements intact.  Cardiovascular:     Pulses: Normal pulses.  Pulmonary:     Effort: Pulmonary effort is normal.  Abdominal:     Palpations: Abdomen is soft.  Musculoskeletal:     Cervical back: Neck supple.  Skin:    General: Skin is warm.     Capillary Refill: Capillary refill takes less than 2 seconds.  Neurological:     Mental Status: She is alert. Mental status is at baseline.  Psychiatric:        Behavior: Behavior normal.        Thought Content: Thought content normal.        Judgment: Judgment normal.     Ortho Exam Exam of both shoulders show full active and passive range of motion.  Good strength of the rotator cuff to manual muscle testing.  No  impingement signs.  No tenderness or masses or lesions.   Specialty Comments:  No specialty comments available.  Imaging: No results found.   PMFS History: Patient Active Problem List   Diagnosis Date Noted   Seasonal allergic rhinitis due to pollen 05/25/2023   Chronic left shoulder pain 05/25/2023   Chronic right shoulder pain 05/25/2023   Mixed hyperlipidemia 05/25/2023   Memory deficits 05/25/2023   GAD (generalized anxiety disorder) 05/25/2023   Vitamin D deficiency 05/25/2023   Menorrhagia with irregular cycle 05/25/2023   Elevated serum creatinine 12/18/2020   Rotator cuff tendonitis, left 10/14/2017   Past Medical History:  Diagnosis Date   Abnormal Pap smear age 2   +HPV; Repeats WNL   Biological false positive RPR test 09/28/2012   RPR pos titer  1:1; MHA-TP- negative    Depression 2001   Hx of    H/O LEEP (loop electrosurgical excision procedure) of cervix complicating pregnancy Age 77   Seasonal allergies    Vaginal Pap smear, abnormal     Family History  Problem Relation Age of Onset   Early menopause Mother    Hypertension Father    Lung cancer Maternal Grandmother        smoker   Heart attack Maternal Grandfather    Heart disease Paternal Grandmother    Heart attack Paternal Grandmother 61   Stroke Paternal Grandfather     Past Surgical History:  Procedure Laterality Date   EYE SURGERY Bilateral 2003   retinal reattachment   WISDOM TOOTH EXTRACTION  2000   Social History   Occupational History   Occupation: HR MANAGER    Employer: [employer]  Tobacco Use   Smoking status: Never   Smokeless tobacco: Never  Vaping Use   Vaping status: Never Used  Substance and Sexual Activity   Alcohol use: Yes    Alcohol/week: 4.0 standard drinks of alcohol    Types: 4 Standard drinks or equivalent per week   Drug use: No   Sexual activity: Yes    Partners: Male    Birth control/protection: Other-see comments    Comment: husband has a vasectomy

## 2023-06-11 NOTE — Progress Notes (Deleted)
                Anselmo Pickler, [facility]

## 2023-06-11 NOTE — Progress Notes (Deleted)
[facility] Counselor/Therapist Progress Note  Patient ID: SHAMETRIA RUSSI, MRN: [id number]    Date: 06/11/23  Time Spent: ***  {onmampm:26702} - *** {onmampm:26702} : *** Minutes  Treatment Type: Individual Therapy.  Reported Symptoms: ***  Mental Status Exam: Appearance:  {PSY:22683}     Behavior: {PSY:21022743}  Motor: {PSY:22302}  Speech/Language:  {PSY:22685}  Affect: {PSY:22687}  Mood: {PSY:31886}  Thought process: {PSY:31888}  Thought content:   {PSY:5708001554}  Sensory/Perceptual disturbances:   {PSY:([phone] 343-7158}  Orientation: {PSY:30297}  Attention: {PSY:22877}  Concentration: {PSY:203-592-1721}  Memory: {PSY:(260)435-9226}  Fund of knowledge:  {PSY:203-592-1721}  Insight:   {PSY:203-592-1721}  Judgment:  {PSY:203-592-1721}  Impulse Control: {PSY:203-592-1721}   Risk Assessment: Danger to Self:  {PSY:22692} Self-injurious Behavior: {PSY:22692} Danger to Others: {PSY:22692} Duty to Warn:{PSY:311194} Physical Aggression / Violence:{PSY:21197} Access to Firearms a concern: {PSY:21197} Gang Involvement:{PSY:21197}  Subjective:   Sharon Seller participated in the session, in person in the office with the therapist, and consented to treatment. Phalyn reviewed the events of the past week.   ***   Interventions: {PSY:908-728-7699}  Diagnosis:   No diagnosis found.  Psychiatric Treatment: {YES/NO:21197}, ***  Treatment Plan:  Client Abilities/Strengths [name] ***  Support System: ***  Client Treatment Preferences ***  Client Statement of Needs Latosha would like to ***   Treatment Level {Frequency of sessions.:26745}  Symptoms  ***   (Status: {Symptom Status:26744}) ***   (Status: {Symptom Status:26744})  Goals:   Terris experiences symptoms of ***   Target Date: *** Frequency: {Frequency of sessions.:26745}  Progress: 0 Modality: individual    Therapist will provide referrals for additional resources as appropriate.  Therapist will  provide psycho-education regarding Anagabriela's diagnosis and corresponding treatment approaches and interventions. Licensed Clinical Social Worker, [place], LCSW will support the patient's ability to achieve the goals identified. will employ CBT, BA, Problem-solving, Solution Focused, Mindfulness,  coping skills, & other evidenced-based practices will be used to promote progress towards healthy functioning to help manage decrease symptoms associated with {his/her/their:21314} diagnosis.   Reduce overall level, frequency, and intensity of the feelings of depression, anxiety and panic evidenced by decreased *** from 6 to 7 days/week to 0 to 1 days/week per client report for at least 3 consecutive months. Verbally express understanding of the relationship between feelings of ***depression, ***anxiety and their impact on thinking patterns and behaviors. Verbalize an understanding of the role that distorted thinking plays in creating fears, excessive worry, and ruminations.  [name] participated in the creation of the treatment plan)      Kerry Dory

## 2023-06-11 NOTE — Progress Notes (Deleted)
.  weigh            Casey [name], [facility]

## 2023-06-12 LAB — SEDIMENTATION RATE: Sed Rate: 6 mm/h (ref 0–20)

## 2023-06-12 LAB — ANA: Anti Nuclear Antibody (ANA): NEGATIVE

## 2023-06-12 LAB — RHEUMATOID FACTOR: Rheumatoid fact SerPl-aCnc: <10 [IU]/mL (ref <14)

## 2023-06-12 LAB — URIC ACID: Uric Acid, Serum: 4.3 mg/dL (ref 2.5–7.0)

## 2023-06-15 ENCOUNTER — Encounter: Payer: 59 | Admitting: Licensed Clinical Social Worker

## 2023-06-15 NOTE — Progress Notes (Deleted)
Comprehensive Clinical Assessment (CCA) Note  06/15/2023 Casey Fields [id number]  Time Spent: ***  {LBBHAMPM:26719} - *** {LBBHAMPM:26719}: *** Minutes  Chief Complaint: No chief complaint on file.  Visit Diagnosis: ***   Guardian/Payee:  ***    Paperwork requested: {BMW:41324}  Reason for Visit /Presenting Problem: ***  Mental Status Exam: Appearance:   {PSY:22683}     Behavior:  {PSY:21022743}  Motor:  {PSY:22302}  Speech/Language:   {PSY:22685}  Affect:  {PSY:22687}  Mood:  {PSY:31886}  Thought process:  {PSY:31888}  Thought content:    {[phone]}  Sensory/Perceptual disturbances:    {PSY:352-476-6281}  Orientation:  {PSY:30297}  Attention:  {PSY:22877}  Concentration:  {PSY:9204585394}  Memory:  {[phone]}  Fund of knowledge:   {PSY:9204585394}  Insight:    {PSY:9204585394}  Judgment:   {PSY:9204585394}  Impulse Control:  {PSY:9204585394}   Reported Symptoms:  ***  Risk Assessment: Danger to Self:  {PSY:22692} Self-injurious Behavior: {PSY:22692} Danger to Others: {PSY:22692} Duty to Warn:{PSY:311194} Physical Aggression / Violence:{PSY:21197} Access to Firearms a concern: {PSY:21197} Gang Involvement:{PSY:21197} Patient / guardian was educated about steps to take if suicide or homicide risk level increases between visits: {Yes/No-Ex:120004} While future psychiatric events cannot be accurately predicted, the patient does not currently require acute inpatient psychiatric care and does not currently meet [facility] involuntary commitment criteria.  Substance Abuse History: Current substance abuse: {PSY:21197}    Caffeine: Tobacco: Alcohol: Substance use:  Past Psychiatric History:   {Past psych history:20559} Outpatient Providers:*** History of Psych Hospitalization: {PSY:21197} Psychological Testing: {PSY:21014032}   Abuse History:  Victim of: {Abuse History:314532}, {Type of abuse:20566}   Report needed: {PSY:314532} Victim of  Neglect:{yes no:314532} Perpetrator of {PSY:20566}  Witness / Exposure to Domestic Violence: {PSY:21197}  Protective Services Involvement: {PSY:21197} Witness to [employer] Violence:  {PSY:21197}  Family History:  Family History  Problem Relation Age of Onset   Early menopause Mother    Hypertension Father    Lung cancer Maternal Grandmother        smoker   Heart attack Maternal Grandfather    Heart disease Paternal Grandmother    Heart attack Paternal Grandmother 62   Stroke Paternal Grandfather     Living situation: the patient {lives:315711::"lives with their family"}  Sexual Orientation: {Sexual Orientation:6842983145}  Relationship Status: {Desc; marital status:62}  Name of spouse / other:*** If a parent, number of children / ages:***  Support Systems: {DIABETES SUPPORT:20310}  Financial Stress:  {YES/NO:21197}  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[employer]  Educational History: Education: {PSY :31912}  Religion/Sprituality/World View: {CHL AMB RELIGION/[phone]}  Any cultural differences that may affect / interfere with treatment:  {Religious/Cultural:200019}  Recreation/Hobbies: {Woc hobbies:30428}  Stressors: {PATIENT STRESSORS:22669}  Strengths: {Patient Coping Strengths:[phone]}  Barriers:  ***   Legal History: Pending legal issue / charges: {PSY:20588} History of legal issue / charges: {Legal Issues:[phone]}  Medical History/Surgical History: {Desc; reviewed/not reviewed:60074} Past Medical History:  Diagnosis Date   Abnormal Pap smear age 12   +HPV; Repeats WNL   Biological false positive RPR test 09/28/2012   RPR pos titer 1:1; MHA-TP- negative    Depression 2001   Hx of    H/O LEEP (loop electrosurgical excision procedure) of cervix complicating pregnancy Age 36   Seasonal allergies    Vaginal Pap smear, abnormal     Past Surgical History:  Procedure Laterality Date    EYE SURGERY Bilateral 2003   retinal reattachment   WISDOM TOOTH EXTRACTION  2000    Medications: Current Outpatient Medications  Medication Sig Dispense Refill  cetirizine (ZYRTEC) 5 MG tablet Take 5 mg by mouth daily.     No current facility-administered medications for this visit.    Allergies  Allergen Reactions   Penicillins Hives    Diagnoses:  No diagnosis found.  Psychiatric Treatment: {YES/NO:21197}, ***  Plan of Care: ***  Narrative:   Casey Fields participated from {Patient Location:[address]::"home"}, via {LBBHVIDEOORPHONE:26720}, is aware of tele-sessions limitations, and consented to treatment. Therapist participated from {LBBHPROVIDERLOCATION:26721}. We reviewed the limits of confidentiality prior to the start of the evaluation. Casey Fields expressed understanding and agreement to proceed.   A follow-up was scheduled to create a treatment plan and begin treatment. Therapist answered all questions during the evaluation and contact information was provided.    Casey Fields

## 2023-06-15 NOTE — Progress Notes (Deleted)
Comprehensive Clinical Assessment (CCA) Note  06/15/2023 DIAMANI BERGES [id number]  Time Spent: ***  {LBBHAMPM:26719} - *** {LBBHAMPM:26719}: *** Minutes  Chief Complaint: No chief complaint on file.  Visit Diagnosis: ***   Guardian/Payee:  ***    Paperwork requested: {WUJ:81191}  Reason for Visit /Presenting Problem: ***  Mental Status Exam: Appearance:   {PSY:22683}     Behavior:  {PSY:21022743}  Motor:  {PSY:22302}  Speech/Language:   {PSY:22685}  Affect:  {PSY:22687}  Mood:  {PSY:31886}  Thought process:  {PSY:31888}  Thought content:    {PSY:9301430473}  Sensory/Perceptual disturbances:    {PSY:4178109653}  Orientation:  {PSY:30297}  Attention:  {PSY:22877}  Concentration:  {PSY:917-680-5018}  Memory:  {PSY:743-509-2694}  Fund of knowledge:   {PSY:917-680-5018}  Insight:    {PSY:917-680-5018}  Judgment:   {PSY:917-680-5018}  Impulse Control:  {PSY:917-680-5018}   Reported Symptoms:  ***  Risk Assessment: Danger to Self:  {PSY:22692} Self-injurious Behavior: {PSY:22692} Danger to Others: {PSY:22692} Duty to Warn:{PSY:311194} Physical Aggression / Violence:{PSY:21197} Access to Firearms a concern: {PSY:21197} Gang Involvement:{PSY:21197} Patient / guardian was educated about steps to take if suicide or homicide risk level increases between visits: {Yes/No-Ex:120004} While future psychiatric events cannot be accurately predicted, the patient does not currently require acute inpatient psychiatric care and does not currently meet [facility] involuntary commitment criteria.  Substance Abuse History: Current substance abuse: {PSY:21197}    Caffeine: Tobacco: Alcohol: Substance use:  Past Psychiatric History:   {Past psych history:20559} Outpatient Providers:*** History of Psych Hospitalization: {PSY:21197} Psychological Testing: {PSY:21014032}   Abuse History:  Victim of: {Abuse History:314532}, {Type of abuse:20566}   Report needed: {PSY:314532} Victim of  Neglect:{yes no:314532} Perpetrator of {PSY:20566}  Witness / Exposure to Domestic Violence: {PSY:21197}  Protective Services Involvement: {PSY:21197} Witness to [employer] Violence:  {PSY:21197}  Family History:  Family History  Problem Relation Age of Onset   Early menopause Mother    Hypertension Father    Lung cancer Maternal Grandmother        smoker   Heart attack Maternal Grandfather    Heart disease Paternal Grandmother    Heart attack Paternal Grandmother 53   Stroke Paternal Grandfather     Living situation: the patient {lives:315711::"lives with their family"}  Sexual Orientation: {Sexual [phone]}  Relationship Status: {Desc; marital status:62}  Name of spouse / other:*** If a parent, number of children / ages:***  Support Systems: {DIABETES SUPPORT:20310}  Financial Stress:  {YES/NO:21197}  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[employer]  Educational History: Education: {PSY :31912}  Religion/Sprituality/World View: {CHL AMB RELIGION/SPIRITUALITY:[phone]}  Any cultural differences that may affect / interfere with treatment:  {Religious/Cultural:200019}  Recreation/Hobbies: {Woc hobbies:30428}  Stressors: {PATIENT STRESSORS:22669}  Strengths: {Patient Coping Strengths:[phone]}  Barriers:  ***   Legal History: Pending legal issue / charges: {PSY:20588} History of legal issue / charges: {Legal Issues:[phone]}  Medical History/Surgical History: {Desc; reviewed/not reviewed:60074} Past Medical History:  Diagnosis Date   Abnormal Pap smear age 33   +HPV; Repeats WNL   Biological false positive RPR test 09/28/2012   RPR pos titer 1:1; MHA-TP- negative    Depression 2001   Hx of    H/O LEEP (loop electrosurgical excision procedure) of cervix complicating pregnancy Age 40   Seasonal allergies    Vaginal Pap smear, abnormal     Past Surgical History:  Procedure Laterality Date    EYE SURGERY Bilateral 2003   retinal reattachment   WISDOM TOOTH EXTRACTION  2000    Medications: Current Outpatient Medications  Medication Sig Dispense Refill  cetirizine (ZYRTEC) 5 MG tablet Take 5 mg by mouth daily.     No current facility-administered medications for this visit.    Allergies  Allergen Reactions   Penicillins Hives    Diagnoses:  No diagnosis found.  Psychiatric Treatment: {YES/NO:21197}, ***  Plan of Care: ***  Narrative:   Sharon Seller participated from {Patient Location:[address]::"home"}, via {LBBHVIDEOORPHONE:26720}, is aware of tele-sessions limitations, and consented to treatment. Therapist participated from {LBBHPROVIDERLOCATION:26721}. We reviewed the limits of confidentiality prior to the start of the evaluation. Sharon Seller expressed understanding and agreement to proceed.   A follow-up was scheduled to create a treatment plan and begin treatment. Therapist answered all questions during the evaluation and contact information was provided.    Kerry Dory

## 2023-06-15 NOTE — Progress Notes (Signed)
This encounter was created in error - please disregard.

## 2023-06-26 ENCOUNTER — Ambulatory Visit (INDEPENDENT_AMBULATORY_CARE_PROVIDER_SITE_OTHER): Payer: 59 | Admitting: Sports Medicine

## 2023-06-26 ENCOUNTER — Encounter: Payer: Self-pay | Admitting: Sports Medicine

## 2023-06-26 ENCOUNTER — Other Ambulatory Visit: Payer: Self-pay

## 2023-06-26 DIAGNOSIS — G8929 Other chronic pain: Secondary | ICD-10-CM | POA: Diagnosis not present

## 2023-06-26 DIAGNOSIS — M25511 Pain in right shoulder: Secondary | ICD-10-CM

## 2023-06-26 DIAGNOSIS — M25512 Pain in left shoulder: Secondary | ICD-10-CM

## 2023-06-26 MED ORDER — METHYLPREDNISOLONE ACETATE 40 MG/ML IJ SUSP
40.0000 mg | INTRAMUSCULAR | Status: AC | PRN
  Administered 2023-06-26: 40 mg via INTRA_ARTICULAR

## 2023-06-26 MED ORDER — BUPIVACAINE HCL 0.25 % IJ SOLN
2.0000 mL | INTRAMUSCULAR | Status: AC | PRN
  Administered 2023-06-26: 2 mL via INTRA_ARTICULAR

## 2023-06-26 MED ORDER — LIDOCAINE HCL 1 % IJ SOLN
2.0000 mL | INTRAMUSCULAR | Status: AC | PRN
  Administered 2023-06-26: 2 mL

## 2023-06-26 NOTE — Progress Notes (Signed)
   Procedure Note  Patient: Casey Fields             Date of Birth: 06/26/1981           MRN: [id number]             Visit Date: 06/26/2023  Procedures: Visit Diagnoses:  1. Chronic right shoulder pain   2. Chronic left shoulder pain    Large Joint Inj: R glenohumeral on 06/26/2023 1:08 PM Indications: pain Details: 22 G 3.5 in needle, ultrasound-guided posterior approach Medications: 2 mL lidocaine 1 %; 2 mL bupivacaine 0.25 %; 40 mg methylPREDNISolone acetate 40 MG/ML Outcome: tolerated well, no immediate complications  US-guided glenohumeral joint injection, right shoulder After discussion on risks/benefits/indications, informed verbal consent was obtained. A timeout was then performed. The patient was positioned lying lateral recumbent on examination table. The patient's shoulder was prepped with betadine and multiple alcohol swabs and utilizing ultrasound guidance, the patient's glenohumeral joint was identified on ultrasound. Using ultrasound guidance a 22-gauge, 3.5 inch needle with a mixture of 2:2:1 cc's lidocaine:bupivicaine:depomedrol was directed from a lateral to medial direction via in-plane technique into the glenohumeral joint with visualization of appropriate spread of injectate into the joint. Patient tolerated the procedure well without immediate complications.      Procedure, treatment alternatives, risks and benefits explained, specific risks discussed. Consent was given by the patient. Immediately prior to procedure a time out was called to verify the correct patient, procedure, equipment, support staff and site/side marked as required. Patient was prepped and draped in the usual sterile fashion.    Large Joint Inj: L glenohumeral on 06/26/2023 1:09 PM Indications: pain Details: 22 G 3.5 in needle, ultrasound-guided posterior approach Medications: 2 mL lidocaine 1 %; 2 mL bupivacaine 0.25 %; 40 mg methylPREDNISolone acetate 40 MG/ML Outcome: tolerated well, no  immediate complications  US-guided glenohumeral joint injection, left shoulder After discussion on risks/benefits/indications, informed verbal consent was obtained. A timeout was then performed. The patient was positioned lying lateral recumbent on examination table. The patient's shoulder was prepped with betadine and multiple alcohol swabs and utilizing ultrasound guidance, the patient's glenohumeral joint was identified on ultrasound. Using ultrasound guidance a 22-gauge, 3.5 inch needle with a mixture of 2:2:1 cc's lidocaine:bupivicaine:depomedrol was directed from a lateral to medial direction via in-plane technique into the glenohumeral joint with visualization of appropriate spread of injectate into the joint. Patient tolerated the procedure well without immediate complications.      Procedure, treatment alternatives, risks and benefits explained, specific risks discussed. Consent was given by the patient. Immediately prior to procedure a time out was called to verify the correct patient, procedure, equipment, support staff and site/side marked as required. Patient was prepped and draped in the usual sterile fashion.     - I evaluated the patient about 5 minutes post-injection and already had improvement in pain and range of motion - follow-up with Dr. Roda Shutters as indicated; I am happy to see them as needed  Madelyn Brunner, DO Primary Care Sports Medicine Physician  [facility] - [facility]  This note was dictated using Dragon naturally speaking software and may contain errors in syntax, spelling, or content which have not been identified prior to signing this note.

## 2023-07-02 ENCOUNTER — Ambulatory Visit
Admission: RE | Admit: 2023-07-02 | Discharge: 2023-07-02 | Disposition: A | Discharge status: Home / Self Care | Payer: 59 | Source: Ambulatory Visit | Attending: Family | Admitting: Family

## 2023-07-02 DIAGNOSIS — Z1231 Encounter for screening mammogram for malignant neoplasm of breast: Secondary | ICD-10-CM

## 2023-07-09 ENCOUNTER — Ambulatory Visit: Payer: 59 | Admitting: Licensed Clinical Social Worker

## 2023-07-09 DIAGNOSIS — F418 Other specified anxiety disorders: Secondary | ICD-10-CM

## 2023-07-09 DIAGNOSIS — F4323 Adjustment disorder with mixed anxiety and depressed mood: Secondary | ICD-10-CM
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
## 2023-07-14 ENCOUNTER — Ambulatory Visit: Payer: 59

## 2023-08-03 ENCOUNTER — Ambulatory Visit: Payer: 59 | Admitting: Licensed Clinical Social Worker

## 2023-08-03 DIAGNOSIS — F4323 Adjustment disorder with mixed anxiety and depressed mood: Secondary | ICD-10-CM | POA: Diagnosis not present

## 2023-08-03 NOTE — Progress Notes (Signed)
 [facility] Counselor/Therapist Progress Note  Patient ID: CALEESI KOHL, MRN: [id number]   Date: 08/03/23  Time Spent: 9:05  am - 10:00 am : 55 Minutes  Treatment Type: Individual Therapy.  Reported Symptoms: self confidence, obsess over challenges in life with the need to take over, sleep disturbances, ruminate topics around fear   Mental Status Exam: Appearance:  Neat     Behavior: Appropriate  Motor: Normal  Speech/Language:  Normal Rate  Affect: Appropriate  Mood: anxious  Thought process: normal  Thought content:   WNL  Sensory/Perceptual disturbances:   WNL  Orientation: oriented to person, place, and time/date  Attention: Good  Concentration: Good  Memory: WNL  Fund of knowledge:  Good  Insight:   Good  Judgment:  Good  Impulse Control: Good   Risk Assessment: Danger to Self:  No Self-injurious Behavior: No Danger to Others: No Duty to Warn:no Physical Aggression / Violence:No  Access to Firearms a concern: No  Gang Involvement:No   Subjective:   Harlene VEAR Moats participated from home, via video and consented to treatment. Therapist participated from home office. I discussed the limitations of evaluation and management by telemedicine and the availability of in person appointments. The patient expressed understanding and agreed to proceed. Lalonnie reviewed the events of the past week.     We reviewed numerous treatment approaches including CBT and Solution focused therapy. Psych-education regarding the Lisset's diagnosis of adj with mixed anxiety/depressed mood was provided during the session. We discussed CARREN BLAKLEY goals treatment goals which include awareness of triggers, fears and concern that drive anxiety and feelings of low self esteem and inadequacy. Harlene VEAR Moats provided verbal approval of the treatment plan.   Interventions: Psycho-education & Goal Setting.   Diagnosis:  Adj mixed anxiety/depressed mood  Psychiatric Treatment:  No , None   Treatment Plan:  Client Abilities/Strengths Miryam is self aware and open to change.    Support System: Family/friends  Client Treatment Preferences Virtual  Treatment Level Weekly  Symptoms  Sleep disturbances   (Status: maintained) Anxious/Body    (Status: maintained)  Goals:   Kehaulani agreed to set goal to identify and verbalize sources of anxiety, fears and concerns in an effort to more accepting and positive about her talents/abilities.    Treatment plan signed and available on s-drive:  Yes    Target Date: 09/09/23 Frequency: Weekly  Progress: 0 Modality: individual    Therapist will provide referrals for additional resources as appropriate.  Therapist will provide psycho-education regarding Hasina's diagnosis and corresponding treatment approaches and interventions. Licensed Clinical Mental Health Counselor, Tawni Louder, [facility] will support the patient's ability to achieve the goals identified. will employ CBT, BA, Problem-solving, Solution Focused, Mindfulness,  coping skills, & other evidenced-based practices will be used to promote progress towards healthy functioning to help manage decrease symptoms associated with her diagnosis.   Reduce overall level, frequency, and intensity of the feelings of depression, anxiety and panic evidenced by decreased self doubt and negative talk. Verbally express understanding of the relationship between feelings of depression, anxiety and their impact on thinking patterns and behaviors. Verbalize an understanding of the role that distorted thinking plays in creating fears, excessive worry, and ruminations.    Maya participated in the creation of the treatment plan)    Deyna Carbon, LCMHC

## 2023-08-13 ENCOUNTER — Ambulatory Visit: Payer: 59 | Admitting: Licensed Clinical Social Worker

## 2023-08-17 ENCOUNTER — Ambulatory Visit: Payer: 59 | Admitting: Licensed Clinical Social Worker

## 2023-08-24 ENCOUNTER — Ambulatory Visit (INDEPENDENT_AMBULATORY_CARE_PROVIDER_SITE_OTHER): Payer: 59 | Admitting: Licensed Clinical Social Worker

## 2023-08-24 DIAGNOSIS — F4323 Adjustment disorder with mixed anxiety and depressed mood: Secondary | ICD-10-CM

## 2023-08-24 NOTE — Progress Notes (Signed)
[facility] Counselor/Therapist Progress Note  Patient ID: KEERTHI HAZELL, MRN: [id number]    Date: 08/24/23  Time Spent: 3:05  pm - 4:02 pm : 57 Minutes  Treatment Type: Individual Therapy.  Reported Symptoms: feeling overwhelmed and extremely stressed at work, more busy time of the year  Mental Status Exam: Appearance:  Neat     Behavior: Appropriate, Sharing, and Rationalizing  Motor: Normal  Speech/Language:  Normal Rate  Affect: Appropriate  Mood: normal  Thought process: normal  Thought content:   WNL  Sensory/Perceptual disturbances:   WNL  Orientation: oriented to person, place, and time/date  Attention: Good  Concentration: Good  Memory: WNL  Fund of knowledge:  Good  Insight:   Good  Judgment:  Good  Impulse Control: Good   Risk Assessment: Danger to Self:  No Self-injurious Behavior: No Danger to Others: No Duty to Warn:no Physical Aggression / Violence:No  Access to Firearms a concern: No  Gang Involvement:No   Subjective:   Sharon Seller participated from home, via video, and consented to treatment. I discussed the limitations of evaluation and management by telemedicine and the availability of in person appointments. The patient expressed understanding and agreed to proceed.  Therapist participated from home office.  Rosalba reviewed the events of the past week.      Interventions: Cognitive Behavioral Therapy and Assertiveness/Communication  Diagnosis:  Adjustment disorder with mixed anxiety and depressed mood  Psychiatric Treatment: No , N/A  Treatment Plan:  Client Abilities/Strengths Layla appears to be committed to showing up for herself to learn more about self care and building self esteem.    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     Treatment Level Weekly  Symptoms  Stressed Out/Overwhelmed   (Status: declined) Happy   (Status: maintained)  Goals:   Durene experiences symptoms of  identify and verbalize sources of anxiety, fears and concerns in an effort to more accepting and positive about her talents/abilities.     Target Date: 09/09/23 Frequency: Weekly  Progress: 0 Modality: individual    Therapist will provide referrals for additional resources as appropriate.  Therapist will provide psycho-education regarding Haasini's diagnosis and corresponding treatment approaches and interventions. Licensed Clinical Mental Health Counselor, Anselmo Pickler, [facility] will support the patient's ability to achieve the goals identified. will employ CBT, BA, Problem-solving, Solution Focused, Mindfulness,  coping skills, & other evidenced-based practices will be used to promote progress towards healthy functioning to help manage decrease symptoms associated with her diagnosis.   Reduce overall level, frequency, and intensity of the feelings of depression, anxiety and panic evidenced by decreased negative feelings of worth.  Verbally express understanding of the relationship between feelings of depression, anxiety and their impact on thinking patterns and behaviors. Verbalize an understanding of the role that distorted thinking plays in creating fears, excessive worry, and ruminations.  [name] participated in the creation of the treatment plan)   Anselmo Pickler, [facility]

## 2023-08-31 ENCOUNTER — Ambulatory Visit (INDEPENDENT_AMBULATORY_CARE_PROVIDER_SITE_OTHER): Payer: 59 | Admitting: Licensed Clinical Social Worker

## 2023-08-31 DIAGNOSIS — F4323 Adjustment disorder with mixed anxiety and depressed mood: Secondary | ICD-10-CM | POA: Diagnosis not present

## 2023-08-31 NOTE — Progress Notes (Signed)
 [facility] Counselor/Therapist Progress Note  Patient ID: HEIDEE ANANIA, MRN: [id number]    Date: 08/31/23  Time Spent: 2:20  pm - 2:43 pm : 23 Minutes  Treatment Type: Individual Therapy.  Reported Symptoms: overwhelmed, distracted, busy  Mental Status Exam: Appearance:  Neat     Behavior: Rigid and Assertive  Motor: Normal  Speech/Language:  Clear and Coherent  Affect: Appropriate  Mood: normal  Thought process: normal  Thought content:   WNL  Sensory/Perceptual disturbances:   WNL  Orientation: oriented to person, place, and time/date  Attention: Good  Concentration: Good  Memory: WNL  Fund of knowledge:  Good  Insight:   Good  Judgment:  Good  Impulse Control: Good   Risk Assessment: Danger to Self:  No Self-injurious Behavior: No Danger to Others: No Duty to Warn:no Physical Aggression / Violence:No  Access to Firearms a concern: No  Gang Involvement:No   Subjective:   Boneta Busing participated from office, via video, and consented to treatment. I discussed the limitations of evaluation and management by telemedicine and the availability of in person appointments. The patient expressed understanding and agreed to proceed.  Therapist participated from home office.  Marcedes reviewed the events of the past week.      Interventions:  N/A  Diagnosis:  Adjustment disorder with mixed anxiety and depressed mood   Psychiatric Treatment: No , N/A  Treatment Plan:  Client Abilities/Strengths Norell shows up for sessions  Support System: Family  Client Treatment Preferences Virtual  Client Statement of Needs Liberti would like to work through her imposter syndrome.     Treatment Level Biweekly  Symptoms  Distracted   (Status: declined) N/A   (Status: maintained)  Goals:   Sherrina experiences symptoms of being overwhelmed and busy at work.     Target Date: 09/09/23 Frequency: Biweekly  Progress: 30 Modality: individual     Therapist will provide referrals for additional resources as appropriate.  Therapist will provide psycho-education regarding Karine's diagnosis and corresponding treatment approaches and interventions. Licensed Clinical Mental Health Counselor, Grandville Lax, [facility] will support the patient's ability to achieve the goals identified. will employ CBT, BA, Problem-solving, Solution Focused, Mindfulness,  coping skills, & other evidenced-based practices will be used to promote progress towards healthy functioning to help manage decrease symptoms associated with her diagnosis.   Reduce overall level, frequency, and intensity of the feelings of depression, anxiety and panic evidenced by decreased work anxiety.  Verbally express understanding of the relationship between feelings of depression, anxiety and their impact on thinking patterns and behaviors. Verbalize an understanding of the role that distorted thinking plays in creating fears, excessive worry, and ruminations.  Ammon Bales participated in the creation of the treatment plan)   Keo Schirmer, LCMHC

## 2023-09-24 ENCOUNTER — Ambulatory Visit: Payer: 59 | Admitting: Licensed Clinical Social Worker

## 2023-09-24 DIAGNOSIS — F4322 Adjustment disorder with anxiety: Secondary | ICD-10-CM

## 2023-09-24 NOTE — Progress Notes (Addendum)
 [facility] Counselor/Therapist Progress Note  Fields ID: Casey Fields, MRN: [id number]    Date: 09/24/23  Time Spent: 9:00  am - 10:00 am : 60 Minutes  Treatment Type: Individual Therapy.  Reported Symptoms: feeling better overall and more empowered by implementing strategies we discussed in session, power and strength in recognizing choices  Mental Status Exam: Appearance:  Well Groomed     Behavior: Sharing and Rationalizing  Motor: Normal  Speech/Language:  Normal Rate  Affect: Appropriate  Mood: normal  Thought process: normal  Thought content:   WNL  Sensory/Perceptual disturbances:   WNL  Orientation: oriented to person, place, and time/date  Attention: Good  Concentration: Good  Memory: WNL  Fund of knowledge:  Good  Insight:   Good  Judgment:  Good  Impulse Control: Good   Risk Assessment: Danger to Self:  No Self-injurious Behavior: No Danger to Others: No Duty to Warn:no Physical Aggression / Violence:No  Access to Firearms a concern: No  Gang Involvement:No   Subjective:   Casey Fields participated from office, via video, and consented to treatment. I discussed Casey limitations of evaluation and management by telemedicine and Casey availability of in person appointments. Casey Fields expressed understanding and agreed to proceed.  Therapist participated from home office.  Ardith reviewed Casey events of Casey past week.      Interventions: Cognitive Behavioral Therapy, Assertiveness/Communication, and Motivational Interviewing  Diagnosis:  Adj with anxiety  Psychiatric Treatment: No , N/A  Treatment Plan:  Client Abilities/Strengths Casey Fields has power and strength in recognizing choices she can make that best serve her versus over extending for others.    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   Treatment  Level Monthly  Symptoms  Hopeful   (Status: maintained) Curious/Inquisitive   (Status: maintained)  Goals:   Casey Fields experiences symptoms of feeling more hopeful and less defeat than.  She is processing things and focusing on things that matter to her.     Target Date: 09/24/23 Frequency: Monthly  Progress: 90 Modality: individual    Therapist will provide referrals for additional resources as appropriate.  Therapist will provide psycho-education regarding Casey Fields's diagnosis and corresponding treatment approaches and interventions. Licensed Clinical Mental Health Counselor, Anselmo Pickler, [facility] will support Casey Fields's ability to achieve Casey goals identified. will employ CBT, BA, Problem-solving, Solution Focused, Mindfulness,  coping skills, & other evidenced-based practices will be used to promote progress towards healthy functioning to help manage decrease symptoms associated with her diagnosis.   Reduce overall level, frequency, and intensity of Casey feelings of depression, anxiety and panic evidenced by decreased anxious thoughts about abilities.    Verbally express understanding of Casey relationship between feelings of depression, anxiety and their impact on thinking patterns and behaviors. Verbalize an understanding of Casey role that distorted thinking plays in creating fears, excessive worry, and ruminations.  Casey Fields participated in Casey creation of Casey treatment plan)   Anselmo Pickler, [facility]

## 2023-10-05 ENCOUNTER — Ambulatory Visit (INDEPENDENT_AMBULATORY_CARE_PROVIDER_SITE_OTHER): Admitting: Licensed Clinical Social Worker

## 2023-10-05 DIAGNOSIS — F4322 Adjustment disorder with anxiety: Secondary | ICD-10-CM | POA: Diagnosis not present

## 2023-10-05 NOTE — Progress Notes (Signed)
  [facility] Counselor/Therapist Progress Note  Patient ID: Casey Fields, MRN: [id number]    Date: 10/05/23  Time Spent: 9:02  am - 10:00 am : 58 Minutes  Treatment Type: Individual Therapy.  Reported Symptoms: feeling hopeful, joyful and open to desires (wants/needs)  Mental Status Exam: Appearance:  Well Groomed     Behavior: Appropriate  Motor: Normal  Speech/Language:  Normal Rate  Affect: Appropriate  Mood: normal  Thought process: normal  Thought content:   WNL  Sensory/Perceptual disturbances:   WNL  Orientation: oriented to person, place, and time/date  Attention: Good  Concentration: Good  Memory: WNL  Fund of knowledge:  Good  Insight:   Good  Judgment:  Good  Impulse Control: Good   Risk Assessment: Danger to Self:  No Self-injurious Behavior: No Danger to Others: No Duty to Warn:no Physical Aggression / Violence:No  Access to Firearms a concern: No  Gang Involvement:No   Subjective:   Sharon Seller participated from home, via video, and consented to treatment. I discussed the limitations of evaluation and management by telemedicine and the availability of in person appointments. The patient expressed understanding and agreed to proceed.  Therapist participated from home office.  Shantel reviewed the events of the past week.      Interventions: Solution-Oriented/Positive Psychology and Psycho-education/Bibliotherapy  Diagnosis:  Adjustment disorder with anxiety  Psychiatric Treatment: No , N/A  Treatment Plan:  Client Abilities/Strengths Raffaela is open and receptive   Support System: Family and Friends  Hospital doctor Preferences Virtual mornings  Client Statement of Needs Mishti would like to focus more on her self care incorporating more physical exercise.     Treatment Level Weekly  Symptoms  Hopeful   (Status: improved) Joyful   (Status: improved)  Goals:   Betha experiences symptoms of feeling lost in her  job to push her into finding guard rails to slow down. Identify wants versus needs and healthy distractor's and self soothing techniques to manage stressors.     Target Date: 11/16/23 Frequency: Weekly  Progress: 0 Modality: individual    Therapist will provide referrals for additional resources as appropriate.  Therapist will provide psycho-education regarding Nahima's diagnosis and corresponding treatment approaches and interventions. Licensed Clinical Mental Health Counselor, Anselmo Pickler, [facility] will support the patient's ability to achieve the goals identified. will employ CBT, BA, Problem-solving, Solution Focused, Mindfulness,  coping skills, & other evidenced-based practices will be used to promote progress towards healthy functioning to help manage decrease symptoms associated with her diagnosis.   Reduce overall level, frequency, and intensity of the feelings of depression, anxiety and panic evidenced by increased self awareness of wants/needs and ability to independently self sooth and/or distract.   Verbally express understanding of the relationship between feelings of depression, anxiety and their impact on thinking patterns and behaviors. Verbalize an understanding of the role that distorted thinking plays in creating fears, excessive worry, and ruminations.  [name] participated in the creation of the treatment plan)   Anselmo Pickler, [facility]

## 2023-10-19 ENCOUNTER — Ambulatory Visit (INDEPENDENT_AMBULATORY_CARE_PROVIDER_SITE_OTHER): Admitting: Licensed Clinical Social Worker

## 2023-10-19 DIAGNOSIS — F4322 Adjustment disorder with anxiety: Secondary | ICD-10-CM

## 2023-10-19 NOTE — Progress Notes (Signed)
  [facility] Counselor/Therapist Progress Note  Patient ID: Casey Fields, MRN: [id number]    Date: 10/19/23  Time Spent: 9:02  am - 10:05 am : 63 Minutes  Treatment Type: Individual Therapy.  Reported Symptoms: has had  Mental Status Exam: Appearance:  Casual     Behavior: Sharing  Motor: Normal  Speech/Language:  Normal Rate  Affect: Appropriate  Mood: normal  Thought process: normal  Thought content:   WNL  Sensory/Perceptual disturbances:   WNL  Orientation: oriented to person, place, and time/date  Attention: Good  Concentration: Good  Memory: WNL  Fund of knowledge:  Good  Insight:   Good  Judgment:  Good  Impulse Control: Good   Risk Assessment: Danger to Self:  No Self-injurious Behavior: No Danger to Others: No Duty to Warn:no Physical Aggression / Violence:No  Access to Firearms a concern: No  Gang Involvement:No   Subjective:   Casey Fields participated from home, via video, and consented to treatment. I discussed the limitations of evaluation and management by telemedicine and the availability of in person appointments. The patient expressed understanding and agreed to proceed.  Therapist participated from home office.  Casey Fields reviewed the events of the past week.      Interventions: Narrative and Insight-Oriented  Diagnosis:  Adjustment disorder with anxiety  Psychiatric Treatment: No , N/A  Treatment Plan:  Client Abilities/Strengths Casey Fields has been more focused on her journey and committed to herself now that her workload has become steady.  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     Treatment Level Biweekly  Symptoms  Helpful/Joyful   (Status: maintained) Defensive   (Status: maintained)  Goals:   Casey Fields experiences symptoms of feeling concerned about failure, sharing out  about mother daughter dynamic and stressors around this, fear of not becoming mother.  Will be on vacation-booked out about a month due to this.  Failure journal prompts.     Target Date: 11/16/23 Frequency: Biweekly  Progress: 0 Modality: individual    Therapist will provide referrals for additional resources as appropriate.  Therapist will provide psycho-education regarding Casey Fields diagnosis and corresponding treatment approaches and interventions. Licensed Clinical Mental Health Counselor, Anselmo Pickler, [facility] will support the patient's ability to achieve the goals identified. will employ CBT, BA, Problem-solving, Solution Focused, Mindfulness,  coping skills, & other evidenced-based practices will be used to promote progress towards healthy functioning to help manage decrease symptoms associated with her diagnosis.   Reduce overall level, frequency, and intensity of the feelings of depression, anxiety and panic evidenced by decreased anxious thoughts and fears around failure.   Verbally express understanding of the relationship between feelings of depression, anxiety and their impact on thinking patterns and behaviors. Verbalize an understanding of the role that distorted thinking plays in creating fears, excessive worry, and ruminations.  Casey Fields participated in the creation of the treatment plan)   Anselmo Pickler, [facility]

## 2023-11-12 ENCOUNTER — Ambulatory Visit: Admitting: Licensed Clinical Social Worker

## 2023-11-23 ENCOUNTER — Ambulatory Visit (INDEPENDENT_AMBULATORY_CARE_PROVIDER_SITE_OTHER): Admitting: Licensed Clinical Social Worker

## 2023-11-23 DIAGNOSIS — F4322 Adjustment disorder with anxiety: Secondary | ICD-10-CM | POA: Diagnosis not present

## 2023-11-23 NOTE — Progress Notes (Signed)
 [place] [facility] Counselor/Therapist Progress Note  Patient ID: Casey Fields, MRN: [id number]    Date: 11/23/23  Time Spent: 2:04  pm - 3:00 pm : 56 Minutes  Treatment Type: Individual Therapy.  Reported Symptoms: family vacation was restful and reflective, comfortable with the uncomfortable, not defined by work  Mental Status Exam: Appearance:  Well Groomed     Behavior: Sharing  Motor: Normal  Speech/Language:  Normal Rate  Affect: Appropriate  Mood: normal  Thought process: goal directed  Thought content:   WNL  Sensory/Perceptual disturbances:   WNL  Orientation: oriented to person, place, and time/date  Attention: Good  Concentration: Good  Memory: WNL  Fund of knowledge:  Good  Insight:   Good  Judgment:  Good  Impulse Control: Good   Risk Assessment: Danger to Self:  No Self-injurious Behavior: No Danger to Others: No Duty to Warn:no Physical Aggression / Violence:No  Access to Firearms a concern: No  Gang Involvement:No   Subjective:   Boneta Busing participated from office, via video, and consented to treatment. I discussed the limitations of evaluation and management by telemedicine and the availability of in person appointments. The patient expressed understanding and agreed to proceed.  Therapist participated from home office.  Kathaline reviewed the events of the past week.      Interventions: Mindfulness Meditation and Narrative  Diagnosis:  Adjustment disorder with anxiety  Psychiatric Treatment: No , N/A  Treatment Plan:  Client Abilities/Strengths Tyrene has done a great job in reflecting on learned strategies and implementing them during our time off.    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     Treatment  Level Biweekly  Symptoms  Refreshed/rejuvenated   (Status: improved) Present/Reflective   (Status: improved)  Goals:   Shantelle experiences symptoms of feeling more grounded and settled in the work we have done.  She has been very reflective in using the strategies we have worked on to make the necessary changes.  Has taken a passiove approach in calming her system down to recognize her true abilities.  Operated in dysfunction and now realized it was to her detriment.  Session focus on catch up from March and new goal setting is TBD next week.  Open to affirmations. Will send audio suggestions per request to MyChart.     Target Date: 11/23/23 Frequency:  TBD  Progress: 0 Modality: individual    Therapist will provide referrals for additional resources as appropriate.  Therapist will provide psycho-education regarding Tena's diagnosis and corresponding treatment approaches and interventions. Licensed Clinical Mental Health Counselor, Grandville Lax, [facility] will support the patient's ability to achieve the goals identified. will employ CBT, BA, Problem-solving, Solution Focused, Mindfulness,  coping skills, & other evidenced-based practices will be used to promote progress towards healthy functioning to help manage decrease symptoms associated with her diagnosis.   Reduce overall level, frequency, and intensity of the feelings of depression, anxiety and panic evidenced by increased awareness of capacity verus capability and ability to delegate and create and enforce boundaries that protect her.   Verbally express understanding of the relationship between feelings of depression, anxiety and their impact on thinking patterns and behaviors. Verbalize an understanding of the role that distorted thinking plays in creating fears, excessive worry, and ruminations.  Ammon Bales participated in the creation  of the treatment plan)   Jasiri Hanawalt, LCMHC

## 2023-12-03 ENCOUNTER — Ambulatory Visit: Admitting: Licensed Clinical Social Worker

## 2023-12-03 DIAGNOSIS — F4322 Adjustment disorder with anxiety: Secondary | ICD-10-CM

## 2023-12-03 NOTE — Progress Notes (Addendum)
 [facility] Counselor/Therapist Progress Note  Patient ID: TENESHA GARZA, MRN: [id number]    Date: 12/03/23  Time Spent: 9:00  am - 9:56 am : 56 Minutes  Treatment Type: Individual Therapy.  Reported Symptoms: reported feeling more connected to self and emotions more, feeling joy and relief in settling into this version of self   Mental Status Exam: Appearance:  Well Groomed     Behavior: Sharing  Motor: Normal  Speech/Language:  Normal Rate  Affect: Appropriate  Mood: normal  Thought process: normal  Thought content:   WNL  Sensory/Perceptual disturbances:   WNL  Orientation: oriented to person, place, and time/date  Attention: Good  Concentration: Good  Memory: WNL  Fund of knowledge:  Good  Insight:   Good  Judgment:  Good  Impulse Control: Good   Risk Assessment: Danger to Self:  No Self-injurious Behavior: No Danger to Others: No Duty to Warn:no Physical Aggression / Violence:No  Access to Firearms a concern: No  Gang Involvement:No   Subjective:   Boneta Busing participated from home, via video, and consented to treatment. I discussed the limitations of evaluation and management by telemedicine and the availability of in person appointments. The patient expressed understanding and agreed to proceed.  Therapist participated from home office.  Clytie reviewed the events of the past week.      Interventions: Mindfulness Meditation and Insight-Oriented  Diagnosis:  Adjustment disorder with anxiety   Psychiatric Treatment: No , N/A  Treatment Plan:  Client Abilities/Strengths Erinne is open ands receptive to growth and making progress.    Support System: Family and Friends  [employer] would like to focus on attending to self and trusting that she is worth of self love and attention to her needs as an employer and as a member of her immediate family.     Treatment  Level Biweekly  Symptoms  Joyful   (Status: improved) Comfortable/Accepting   (Status: improved)  Goals:   Kieli experiences growth in being able to articulate her self worth with appropriate mindfully connected language.  She has showed tremendous growth in slowing down, attending to herself, comprehension and application of the cycle of her emotions and the power she has to regulate those with learned strategies.  Focus today on review of material thus far and introduction of independent guided meditation strategies.  Recap and resources shared.  Patient will f/u as needed for future support.     Target Date: 12/03/23 Frequency: Biweekly  Progress: 0 Modality: individual    Therapist will provide referrals for additional resources as appropriate.  Therapist will provide psycho-education regarding Madhavi's diagnosis and corresponding treatment approaches and interventions. Licensed Clinical Mental Health Counselor, [place]ax, [facility] will support the patient's ability to achieve the goals identified. will employ CBT, BA, Problem-solving, Solution Focused, Mindfulness,  coping skills, & other evidenced-based practices will be used to promote progress towards healthy functioning to help manage decrease symptoms associated with her diagnosis.   Reduce overall level, frequency, and intensity of the feelings of depression, anxiety and panic evidenced by increased self awareness and emotional regulation abilities.  Aiding in more positive decision making.   Verbally express understanding of the relationship between feelings of depression, anxiety and their impact on thinking patterns and behaviors. Verbalize an understanding of the role that distorted thinking plays in creating fears, excessive worry, and ruminations.  Ammon Bales participated in the creation of the treatment plan)   Izmael Duross,  [facility]

## 2024-05-23 ENCOUNTER — Encounter: Payer: Self-pay | Admitting: Radiology

## 2024-06-07 ENCOUNTER — Other Ambulatory Visit: Payer: Self-pay | Admitting: Family

## 2024-06-07 DIAGNOSIS — Z1231 Encounter for screening mammogram for malignant neoplasm of breast: Secondary | ICD-10-CM

## 2024-07-12 ENCOUNTER — Ambulatory Visit
Admission: RE | Admit: 2024-07-12 | Discharge: 2024-07-12 | Disposition: A | Discharge status: Home / Self Care | Source: Ambulatory Visit | Attending: Family | Admitting: Family

## 2024-07-12 DIAGNOSIS — Z1231 Encounter for screening mammogram for malignant neoplasm of breast: Secondary | ICD-10-CM

## 2024-07-22 ENCOUNTER — Ambulatory Visit: Payer: Self-pay | Admitting: Family
